# Patient Record
Sex: Female | Born: 1985 | Race: White | Hispanic: No | Marital: Married | Smoking: Never smoker
Health system: Southern US, Community
[De-identification: ages and names within clinical notes are randomized; demographics above are authoritative.]

## PROBLEM LIST (undated history)

## (undated) DIAGNOSIS — H05119 Granuloma of unspecified orbit: Secondary | ICD-10-CM

## (undated) DIAGNOSIS — O24419 Gestational diabetes mellitus in pregnancy, unspecified control: Secondary | ICD-10-CM

## (undated) DIAGNOSIS — Z8619 Personal history of other infectious and parasitic diseases: Secondary | ICD-10-CM

## (undated) DIAGNOSIS — I10 Essential (primary) hypertension: Secondary | ICD-10-CM

## (undated) DIAGNOSIS — E669 Obesity, unspecified: Secondary | ICD-10-CM

## (undated) DIAGNOSIS — W19XXXA Unspecified fall, initial encounter: Secondary | ICD-10-CM

## (undated) DIAGNOSIS — Y92009 Unspecified place in unspecified non-institutional (private) residence as the place of occurrence of the external cause: Secondary | ICD-10-CM

## (undated) DIAGNOSIS — IMO0002 Reserved for concepts with insufficient information to code with codable children: Secondary | ICD-10-CM

## (undated) DIAGNOSIS — L089 Local infection of the skin and subcutaneous tissue, unspecified: Secondary | ICD-10-CM

## (undated) HISTORY — DX: Obesity, unspecified: E66.9

## (undated) HISTORY — PX: MASTOIDECTOMY: SHX711

## (undated) HISTORY — DX: Granuloma of unspecified orbit: H05.119

## (undated) HISTORY — DX: Essential (primary) hypertension: I10

## (undated) HISTORY — PX: TONSILLECTOMY: SUR1361

## (undated) HISTORY — DX: Local infection of the skin and subcutaneous tissue, unspecified: L08.9

## (undated) HISTORY — PX: CHOLECYSTECTOMY: SHX55

## (undated) HISTORY — PX: MANDIBLE SURGERY: SHX707

## (undated) HISTORY — DX: Personal history of other infectious and parasitic diseases: Z86.19

## (undated) HISTORY — DX: Gestational diabetes mellitus in pregnancy, unspecified control: O24.419

## (undated) HISTORY — PX: BREAST ENHANCEMENT SURGERY: SHX7

---

## 2009-05-23 ENCOUNTER — Ambulatory Visit: Payer: Self-pay | Admitting: Otolaryngology

## 2011-05-06 NOTE — L&D Delivery Note (Signed)
Delivery Note At 10:29 AM a viable female was delivered via Vaginal, Spontaneous Delivery (Presentation: ;DOA).  APGAR: 8/9, ; weight pending .   Placenta status: Intact, Spontaneous.  Cord 3-VC, nuchal x 1 reduced after delivery of fetal head:  with the following complications: .  Cord pH: not indicated  Anesthesia: Epidural  Episiotomy: none Lacerations: none Suture Repair: none Est. Blood Loss (mL): 300  Mom to postpartum.  Baby to nursery-stable.  Lodema Parma A. 03/01/2012, 10:50 AM

## 2011-08-07 LAB — OB RESULTS CONSOLE HEPATITIS B SURFACE ANTIGEN: Hepatitis B Surface Ag: NEGATIVE

## 2011-08-07 LAB — OB RESULTS CONSOLE ANTIBODY SCREEN: Antibody Screen: NEGATIVE

## 2011-08-07 LAB — OB RESULTS CONSOLE GC/CHLAMYDIA
Chlamydia: NEGATIVE
Gonorrhea: NEGATIVE

## 2011-12-04 DIAGNOSIS — W19XXXA Unspecified fall, initial encounter: Secondary | ICD-10-CM

## 2011-12-04 DIAGNOSIS — Y92009 Unspecified place in unspecified non-institutional (private) residence as the place of occurrence of the external cause: Secondary | ICD-10-CM

## 2011-12-04 HISTORY — DX: Unspecified place in unspecified non-institutional (private) residence as the place of occurrence of the external cause: Y92.009

## 2011-12-04 HISTORY — DX: Unspecified fall, initial encounter: W19.XXXA

## 2012-01-18 ENCOUNTER — Inpatient Hospital Stay (HOSPITAL_COMMUNITY): Admission: AD | Admit: 2012-01-18 | Payer: Self-pay | Source: Ambulatory Visit | Admitting: Obstetrics

## 2012-02-17 ENCOUNTER — Telehealth (HOSPITAL_COMMUNITY): Payer: Self-pay | Admitting: *Deleted

## 2012-02-17 ENCOUNTER — Other Ambulatory Visit: Payer: Self-pay | Admitting: Obstetrics

## 2012-02-17 ENCOUNTER — Encounter (HOSPITAL_COMMUNITY): Payer: Self-pay | Admitting: *Deleted

## 2012-02-17 NOTE — Telephone Encounter (Signed)
Preadmission screen  

## 2012-02-28 ENCOUNTER — Encounter (HOSPITAL_COMMUNITY): Payer: Self-pay | Admitting: Pharmacist

## 2012-02-29 ENCOUNTER — Encounter (HOSPITAL_COMMUNITY): Payer: Self-pay

## 2012-02-29 ENCOUNTER — Inpatient Hospital Stay (HOSPITAL_COMMUNITY)
Admission: RE | Admit: 2012-02-29 | Discharge: 2012-03-02 | DRG: 774 | Disposition: A | Discharge status: Home / Self Care | Payer: 59 | Source: Ambulatory Visit | Attending: Obstetrics & Gynecology | Admitting: Obstetrics & Gynecology

## 2012-02-29 VITALS — BP 111/73 | HR 74 | Temp 97.7°F | Resp 18 | Ht 67.0 in | Wt 310.0 lb

## 2012-02-29 DIAGNOSIS — O10919 Unspecified pre-existing hypertension complicating pregnancy, unspecified trimester: Secondary | ICD-10-CM | POA: Diagnosis present

## 2012-02-29 DIAGNOSIS — IMO0002 Reserved for concepts with insufficient information to code with codable children: Secondary | ICD-10-CM | POA: Diagnosis present

## 2012-02-29 DIAGNOSIS — E669 Obesity, unspecified: Secondary | ICD-10-CM | POA: Diagnosis present

## 2012-02-29 DIAGNOSIS — O9903 Anemia complicating the puerperium: Secondary | ICD-10-CM | POA: Diagnosis not present

## 2012-02-29 DIAGNOSIS — O99814 Abnormal glucose complicating childbirth: Principal | ICD-10-CM | POA: Diagnosis present

## 2012-02-29 DIAGNOSIS — O24419 Gestational diabetes mellitus in pregnancy, unspecified control: Secondary | ICD-10-CM | POA: Diagnosis present

## 2012-02-29 DIAGNOSIS — O1002 Pre-existing essential hypertension complicating childbirth: Secondary | ICD-10-CM | POA: Diagnosis present

## 2012-02-29 DIAGNOSIS — D649 Anemia, unspecified: Secondary | ICD-10-CM | POA: Diagnosis not present

## 2012-02-29 HISTORY — DX: Reserved for concepts with insufficient information to code with codable children: IMO0002

## 2012-02-29 HISTORY — DX: Unspecified fall, initial encounter: W19.XXXA

## 2012-02-29 HISTORY — DX: Unspecified place in unspecified non-institutional (private) residence as the place of occurrence of the external cause: Y92.009

## 2012-02-29 LAB — GLUCOSE, CAPILLARY

## 2012-02-29 LAB — CBC
Hemoglobin: 10.5 g/dL — ABNORMAL LOW (ref 12.0–15.0)
MCH: 30.5 pg (ref 26.0–34.0)
RBC: 3.44 MIL/uL — ABNORMAL LOW (ref 3.87–5.11)

## 2012-02-29 LAB — COMPREHENSIVE METABOLIC PANEL
ALT: 11 U/L (ref 0–35)
AST: 15 U/L (ref 0–37)
Alkaline Phosphatase: 179 U/L — ABNORMAL HIGH (ref 39–117)
CO2: 20 mEq/L (ref 19–32)
Chloride: 104 mEq/L (ref 96–112)
Creatinine, Ser: 0.63 mg/dL (ref 0.50–1.10)
GFR calc non Af Amer: >90 mL/min (ref >90)
Potassium: 3.9 mEq/L (ref 3.5–5.1)
Sodium: 136 mEq/L (ref 135–145)
Total Bilirubin: 0.3 mg/dL (ref 0.3–1.2)

## 2012-02-29 MED ORDER — EPHEDRINE 5 MG/ML INJ
10.0000 mg | INTRAVENOUS | Status: DC | PRN
  Filled 2012-02-29: qty 4

## 2012-02-29 MED ORDER — ACETAMINOPHEN 325 MG PO TABS
650.0000 mg | ORAL_TABLET | ORAL | Status: DC | PRN
  Administered 2012-03-01: 650 mg via ORAL
  Filled 2012-02-29: qty 2

## 2012-02-29 MED ORDER — OXYTOCIN BOLUS FROM INFUSION
500.0000 mL | INTRAVENOUS | Status: DC
  Filled 2012-02-29 (×37): qty 500

## 2012-02-29 MED ORDER — LACTATED RINGERS IV SOLN
INTRAVENOUS | Status: DC
  Administered 2012-02-29 – 2012-03-01 (×3): via INTRAVENOUS

## 2012-02-29 MED ORDER — TERBUTALINE SULFATE 1 MG/ML IJ SOLN: 0.2500 mg | Freq: Once | INTRAMUSCULAR | Status: AC | PRN

## 2012-02-29 MED ORDER — OXYCODONE-ACETAMINOPHEN 5-325 MG PO TABS: 1.0000 | ORAL_TABLET | ORAL | Status: DC | PRN

## 2012-02-29 MED ORDER — OXYTOCIN 40 UNITS IN LACTATED RINGERS INFUSION - SIMPLE MED: 62.5000 mL/h | INTRAVENOUS | Status: DC

## 2012-02-29 MED ORDER — FENTANYL 2.5 MCG/ML BUPIVACAINE 1/10 % EPIDURAL INFUSION (WH - ANES)
14.0000 mL/h | INTRAMUSCULAR | Status: DC
  Administered 2012-03-01: 14 mL/h via EPIDURAL
  Filled 2012-02-29 (×2): qty 125

## 2012-02-29 MED ORDER — LIDOCAINE HCL (PF) 1 % IJ SOLN: 30.0000 mL | INTRAMUSCULAR | Status: DC | PRN

## 2012-02-29 MED ORDER — MISOPROSTOL 25 MCG QUARTER TABLET
25.0000 ug | ORAL_TABLET | ORAL | Status: DC | PRN
  Filled 2012-02-29: qty 0.25

## 2012-02-29 MED ORDER — EPHEDRINE 5 MG/ML INJ: 10.0000 mg | INTRAVENOUS | Status: DC | PRN

## 2012-02-29 MED ORDER — ONDANSETRON HCL 4 MG/2ML IJ SOLN
4.0000 mg | Freq: Four times a day (QID) | INTRAMUSCULAR | Status: DC | PRN
  Administered 2012-03-01: 4 mg via INTRAVENOUS
  Filled 2012-02-29: qty 2

## 2012-02-29 MED ORDER — PHENYLEPHRINE 40 MCG/ML (10ML) SYRINGE FOR IV PUSH (FOR BLOOD PRESSURE SUPPORT)
80.0000 ug | PREFILLED_SYRINGE | INTRAVENOUS | Status: DC | PRN
  Filled 2012-02-29: qty 5

## 2012-02-29 MED ORDER — OXYTOCIN 40 UNITS IN LACTATED RINGERS INFUSION - SIMPLE MED
1.0000 m[IU]/min | INTRAVENOUS | Status: DC
  Administered 2012-02-29: 2 m[IU]/min via INTRAVENOUS
  Filled 2012-02-29: qty 1000

## 2012-02-29 MED ORDER — ZOLPIDEM TARTRATE 5 MG PO TABS: 5.0000 mg | ORAL_TABLET | Freq: Every evening | ORAL | Status: DC | PRN

## 2012-02-29 MED ORDER — CITRIC ACID-SODIUM CITRATE 334-500 MG/5ML PO SOLN
30.0000 mL | ORAL | Status: DC | PRN
  Filled 2012-02-29: qty 15

## 2012-02-29 MED ORDER — PHENYLEPHRINE 40 MCG/ML (10ML) SYRINGE FOR IV PUSH (FOR BLOOD PRESSURE SUPPORT): 80.0000 ug | PREFILLED_SYRINGE | INTRAVENOUS | Status: DC | PRN

## 2012-02-29 MED ORDER — DIPHENHYDRAMINE HCL 50 MG/ML IJ SOLN: 12.5000 mg | INTRAMUSCULAR | Status: DC | PRN

## 2012-02-29 MED ORDER — LACTATED RINGERS IV SOLN
500.0000 mL | Freq: Once | INTRAVENOUS | Status: AC
  Administered 2012-03-01: 500 mL via INTRAVENOUS

## 2012-02-29 MED ORDER — LACTATED RINGERS IV SOLN: 500.0000 mL | INTRAVENOUS | Status: DC | PRN

## 2012-02-29 MED ORDER — IBUPROFEN 600 MG PO TABS: 600.0000 mg | ORAL_TABLET | Freq: Four times a day (QID) | ORAL | Status: DC | PRN

## 2012-02-29 NOTE — H&P (Signed)
Samika Vetsch is a 26 y.o. G2P1 at [redacted]w[redacted]d presenting for IOL due to chronic htn and GDM. Pt notes rare contractions. Good fetal movement, No vaginal bleeding, not leaking fluid. No HA, no vision change, no RUQ pain  [facility] since 9 wks PN Issues: - chronic htn, bp remained stable throughout 3rd trimester, 130s/80s. Nl PIH labs. - GDM. Passed 3 hr GTT but given LGA, large AC, BS monitored in 2nd/3rd trimester. FBS >95s and pt started on glyburide. A1C stayed in 4's and BS controlled w/ diet/ glyburide - obesity - s/p Tdap   Prenatal Transfer Tool  Maternal Diabetes: Yes:  Diabetes Type:  Insulin/Medication controlled Genetic Screening: Normal Maternal Ultrasounds/Referrals: Normal Fetal Ultrasounds or other Referrals:  None Maternal Substance Abuse:  No Significant Maternal Medications:  Meds include: Other: glyburide Significant Maternal Lab Results: None     OB History    Grav Para Term Preterm Abortions TAB SAB Ect Mult Living   2 1        1     G1_ SVD at 7'14, PPH, PIH  Past Medical History  Diagnosis Date  . Obese   . H/O varicella   . Orbital pseudotumor     stable annual eye exams  . Unspecified local infection of skin and subcutaneous tissue   . Gestational diabetes     glyburide  . Hypertension     gestational   Past Surgical History  Procedure Date  . Tonsillectomy   . Mastoidectomy     Left   Family History: family history includes COPD in her maternal grandmother; Cancer in her father and maternal grandfather; Diabetes in her maternal grandfather; Migraines in her father; and Thrombocytopenia in her mother. Social History:  reports that she has never smoked. She has never used smokeless tobacco. She reports that she does not drink alcohol or use illicit drugs. Review of Systems - Negative except pregnancy  All: prednisone, tetracycline Meds: PNV, glyburide    Blood pressure 154/82, pulse 72, temperature 98.4 F (36.9 C), temperature  source Oral, resp. rate 20, height 5\' 7"  (1.702 m), weight 140.615 kg (310 lb).  Physical Exam:  Gen: well appearing, no distress CV: RRR Pulm: CTAB Back: no CVAT Abd: gravid, NT, no RUQ pain LE: trace edema, equal bilaterally, non-tender Toco: occasional FH: baseline 135s, accelerations present, no deceleratons, 10 beat variability Cvx: 2/ 20%/ vtx/ mid/ med/ -3 EFW: 8#  Prenatal labs: ABO, Rh: A/Positive/-- (04/04 0000) Antibody: Negative (04/04 0000) Rubella:  immune RPR: Nonreactive (04/04 0000)  HBsAg: Negative (04/04 0000)  HIV: Non-reactive (04/04 0000)  GBS: Negative (09/27 0000)  1 hr Glucola nl 3 hr GTT  Genetic screening nl quad Anatomy US normal   Assessment/Plan: 26 y.o. G2P1 at [redacted]w[redacted]d IOL at term due to chronic htn and GDM - chronic htn. Check PIH labs on admit, watch bp closely - GDM. diebetic clears check BS q 2 hrs when in active labor - GBS neg - h/o PPH. Watch closely  - IOL, given rapid progress in prior preg, 2 cm, will plan pitocin now.  Dean Wonder A. 02/29/2012, 8:24 PM

## 2012-03-01 ENCOUNTER — Encounter (HOSPITAL_COMMUNITY): Payer: Self-pay

## 2012-03-01 ENCOUNTER — Inpatient Hospital Stay (HOSPITAL_COMMUNITY): Payer: 59 | Admitting: Anesthesiology

## 2012-03-01 ENCOUNTER — Encounter (HOSPITAL_COMMUNITY): Payer: Self-pay | Admitting: Anesthesiology

## 2012-03-01 DIAGNOSIS — O24419 Gestational diabetes mellitus in pregnancy, unspecified control: Secondary | ICD-10-CM | POA: Diagnosis present

## 2012-03-01 DIAGNOSIS — O10919 Unspecified pre-existing hypertension complicating pregnancy, unspecified trimester: Secondary | ICD-10-CM | POA: Diagnosis present

## 2012-03-01 LAB — RPR: RPR Ser Ql: NONREACTIVE

## 2012-03-01 LAB — GLUCOSE, CAPILLARY: Glucose-Capillary: 96 mg/dL (ref 70–99)

## 2012-03-01 MED ORDER — BISACODYL 10 MG RE SUPP: 10.0000 mg | Freq: Every day | RECTAL | Status: DC | PRN

## 2012-03-01 MED ORDER — IBUPROFEN 600 MG PO TABS
600.0000 mg | ORAL_TABLET | Freq: Four times a day (QID) | ORAL | Status: DC
  Administered 2012-03-01 – 2012-03-02 (×5): 600 mg via ORAL
  Filled 2012-03-01 (×5): qty 1

## 2012-03-01 MED ORDER — DIBUCAINE 1 % RE OINT: 1.0000 "application " | TOPICAL_OINTMENT | RECTAL | Status: DC | PRN

## 2012-03-01 MED ORDER — LIDOCAINE HCL (PF) 1 % IJ SOLN
INTRAMUSCULAR | Status: DC | PRN
  Administered 2012-03-01: 8 mL

## 2012-03-01 MED ORDER — MISOPROSTOL 200 MCG PO TABS
ORAL_TABLET | ORAL | Status: AC
  Filled 2012-03-01: qty 4

## 2012-03-01 MED ORDER — LANOLIN HYDROUS EX OINT: TOPICAL_OINTMENT | CUTANEOUS | Status: DC | PRN

## 2012-03-01 MED ORDER — BENZOCAINE-MENTHOL 20-0.5 % EX AERO
1.0000 "application " | INHALATION_SPRAY | CUTANEOUS | Status: DC | PRN
  Administered 2012-03-01: 1 via TOPICAL
  Filled 2012-03-01: qty 56

## 2012-03-01 MED ORDER — WITCH HAZEL-GLYCERIN EX PADS: 1.0000 "application " | MEDICATED_PAD | CUTANEOUS | Status: DC | PRN

## 2012-03-01 MED ORDER — PRENATAL MULTIVITAMIN CH
1.0000 | ORAL_TABLET | Freq: Every day | ORAL | Status: DC
  Administered 2012-03-01 – 2012-03-02 (×2): 1 via ORAL
  Filled 2012-03-01 (×2): qty 1

## 2012-03-01 MED ORDER — SIMETHICONE 80 MG PO CHEW: 80.0000 mg | CHEWABLE_TABLET | ORAL | Status: DC | PRN

## 2012-03-01 MED ORDER — FENTANYL 2.5 MCG/ML BUPIVACAINE 1/10 % EPIDURAL INFUSION (WH - ANES)
INTRAMUSCULAR | Status: DC | PRN
  Administered 2012-03-01: 16 mL/h via EPIDURAL

## 2012-03-01 MED ORDER — ONDANSETRON HCL 4 MG PO TABS: 4.0000 mg | ORAL_TABLET | ORAL | Status: DC | PRN

## 2012-03-01 MED ORDER — TETANUS-DIPHTH-ACELL PERTUSSIS 5-2.5-18.5 LF-MCG/0.5 IM SUSP: 0.5000 mL | Freq: Once | INTRAMUSCULAR | Status: DC

## 2012-03-01 MED ORDER — ZOLPIDEM TARTRATE 5 MG PO TABS: 5.0000 mg | ORAL_TABLET | Freq: Every evening | ORAL | Status: DC | PRN

## 2012-03-01 MED ORDER — SENNOSIDES-DOCUSATE SODIUM 8.6-50 MG PO TABS
2.0000 | ORAL_TABLET | Freq: Every day | ORAL | Status: DC
  Administered 2012-03-01: 2 via ORAL

## 2012-03-01 MED ORDER — ONDANSETRON HCL 4 MG/2ML IJ SOLN: 4.0000 mg | INTRAMUSCULAR | Status: DC | PRN

## 2012-03-01 MED ORDER — FLEET ENEMA 7-19 GM/118ML RE ENEM: 1.0000 | ENEMA | Freq: Every day | RECTAL | Status: DC | PRN

## 2012-03-01 MED ORDER — OXYCODONE-ACETAMINOPHEN 5-325 MG PO TABS
1.0000 | ORAL_TABLET | ORAL | Status: DC | PRN
  Administered 2012-03-01 – 2012-03-02 (×4): 1 via ORAL
  Filled 2012-03-01 (×4): qty 1

## 2012-03-01 MED ORDER — DIPHENHYDRAMINE HCL 25 MG PO CAPS: 25.0000 mg | ORAL_CAPSULE | Freq: Four times a day (QID) | ORAL | Status: DC | PRN

## 2012-03-01 NOTE — Progress Notes (Signed)
Comfortable w/ epidural. Notes nausea w/ emesis and shaking  O:  Filed Vitals:   03/01/12 0731 03/01/12 0801 03/01/12 0831 03/01/12 0903  BP: 162/87 167/76 174/94 156/71  Pulse: 68 72 102 81  Temp:      TempSrc:      Resp:      Height:      Weight:      SpO2:       Abd: gravid, NT, EFW 8.5# #cvx: rim from 6 to 10'click, vtx 0 Toco: q 2 mn, pit at 5 munit/min FH: baseline change from 130's to 150's, late decels w/ most contractions, rapid recovery <14min, 5 beat variability  A/P: IOL due to chronic htn and GDM - BS stable, watch PP - h/o PPH - FWB. Baseline change, decels concerning, expect delivery soon, cont close management, O2 - bp noted. Aware of risks of abruption  Vanessa Pratt A. 03/01/2012 9:16 AM

## 2012-03-01 NOTE — Progress Notes (Signed)
S: pt comfortable w/ epidural. No LOF.   OCeasar Mons Vitals:   03/01/12 0116 03/01/12 0131 03/01/12 0201 03/01/12 0231  BP: 141/84 145/82 153/74 144/79  Pulse: 61 70 67 91  Temp:      TempSrc:      Resp: 18 18 18 18   Height:      Weight:      SpO2:       Toco: q 2-3 min, pit at 5 munits/ min FH: 130's, + accels, no decels, 10 beat variability Cvx: ext os 4 cm, int os 2 cm, vtx -3/ ant/ medium consistency  AROM- clear  A/P: G2P1 at 39'3 w/ GDM and chronic htn for IOL - IOL. AROM now, IUPC placed. Progress slower than expected. Cont to titrate pitocin - GDM. Will watch BS when in active labor - chronic htn. Cont to watch. - h/o PPH.   Christa Fasig A. 03/01/2012 3:32 AM

## 2012-03-01 NOTE — Anesthesia Procedure Notes (Signed)
Epidural Patient location during procedure: OB Start time: 03/01/2012 12:12 AM End time: 03/01/2012 12:34 AM  Staffing Anesthesiologist: Lewie Loron R Performed by: anesthesiologist   Preanesthetic Checklist Completed: patient identified, site marked, surgical consent, pre-op evaluation, timeout performed, IV checked, risks and benefits discussed and monitors and equipment checked  Epidural Patient position: sitting Prep: DuraPrep Patient monitoring: heart rate, continuous pulse ox and blood pressure Approach: midline Injection technique: LOR air  Needle:  Needle type: Tuohy  Needle gauge: 17 G Needle length: 9 cm Needle insertion depth: 9 cm Catheter type: closed end flexible Catheter size: 19 Gauge Catheter at skin depth: 15 cm Test dose: negative  Assessment Sensory level: T8

## 2012-03-01 NOTE — Anesthesia Preprocedure Evaluation (Signed)
Anesthesia Evaluation  Patient identified by MRN, date of birth, ID band Patient awake    Reviewed: Allergy & Precautions, H&P , NPO status , Patient's Chart, lab work & pertinent test results, reviewed documented beta blocker date and time   Airway Mallampati: II TM Distance: >3 FB Neck ROM: full    Dental No notable dental hx.    Pulmonary neg pulmonary ROS,  breath sounds clear to auscultation  Pulmonary exam normal       Cardiovascular hypertension, Rhythm:regular Rate:Normal     Neuro/Psych negative neurological ROS  negative psych ROS   GI/Hepatic negative GI ROS, Neg liver ROS,   Endo/Other  Gestational, Oral Hypoglycemic Agents  Renal/GU negative Renal ROS  negative genitourinary   Musculoskeletal   Abdominal Normal abdominal exam  (+)   Peds  Hematology negative hematology ROS (+)   Anesthesia Other Findings   Reproductive/Obstetrics (+) Pregnancy                           Anesthesia Physical Anesthesia Plan  ASA: II  Anesthesia Plan: Epidural   Post-op Pain Management:    Induction:   Airway Management Planned:   Additional Equipment:   Intra-op Plan:   Post-operative Plan:   Informed Consent: I have reviewed the patients History and Physical, chart, labs and discussed the procedure including the risks, benefits and alternatives for the proposed anesthesia with the patient or authorized representative who has indicated his/her understanding and acceptance.     Plan Discussed with: Anesthesiologist  Anesthesia Plan Comments:         Anesthesia Quick Evaluation

## 2012-03-02 ENCOUNTER — Encounter (HOSPITAL_COMMUNITY): Payer: Self-pay

## 2012-03-02 DIAGNOSIS — IMO0002 Reserved for concepts with insufficient information to code with codable children: Secondary | ICD-10-CM

## 2012-03-02 HISTORY — DX: Reserved for concepts with insufficient information to code with codable children: IMO0002

## 2012-03-02 LAB — TYPE AND SCREEN
Antibody Screen: NEGATIVE
Unit division: 0
Unit division: 0

## 2012-03-02 LAB — CBC
HCT: 27.9 % — ABNORMAL LOW (ref 36.0–46.0)
Hemoglobin: 9.5 g/dL — ABNORMAL LOW (ref 12.0–15.0)
RBC: 3.17 MIL/uL — ABNORMAL LOW (ref 3.87–5.11)
RDW: 13 % (ref 11.5–15.5)
WBC: 12.5 10*3/uL — ABNORMAL HIGH (ref 4.0–10.5)

## 2012-03-02 LAB — URIC ACID: Uric Acid, Serum: 5.3 mg/dL (ref 2.4–7.0)

## 2012-03-02 LAB — COMPREHENSIVE METABOLIC PANEL
Albumin: 1.9 g/dL — ABNORMAL LOW (ref 3.5–5.2)
Alkaline Phosphatase: 136 U/L — ABNORMAL HIGH (ref 39–117)
BUN: 9 mg/dL (ref 6–23)
CO2: 23 mEq/L (ref 19–32)
Chloride: 104 mEq/L (ref 96–112)
GFR calc non Af Amer: >90 mL/min (ref >90)
Glucose, Bld: 91 mg/dL (ref 70–99)
Potassium: 3.8 mEq/L (ref 3.5–5.1)
Total Bilirubin: 0.2 mg/dL — ABNORMAL LOW (ref 0.3–1.2)

## 2012-03-02 MED ORDER — IBUPROFEN 600 MG PO TABS: 600.0000 mg | ORAL_TABLET | Freq: Four times a day (QID) | ORAL | Status: DC

## 2012-03-02 MED ORDER — POLYSACCHARIDE IRON COMPLEX 150 MG PO CAPS: 150.0000 mg | ORAL_CAPSULE | Freq: Every day | ORAL | Status: DC

## 2012-03-02 NOTE — Discharge Summary (Signed)
Obstetric Discharge Summary Reason for Admission: induction of labor and GDM A2, CHTN, obesity Prenatal Procedures: NST and ultrasound Intrapartum Procedures: spontaneous vaginal delivery and epidural Postpartum Procedures: none Complications-Operative and Postpartum: none Hemoglobin  Date Value Range Status  03/02/2012 9.5* 12.0 - 15.0 g/dL Final     HCT  Date Value Range Status  03/02/2012 27.9* 36.0 - 46.0 % Final    Physical Exam:  General: alert, cooperative and no distress Lochia: appropriate Uterine Fundus: firm Incision: healing well DVT Evaluation: Negative Homan's sign. No significant calf/ankle edema.  Discharge Diagnoses: Term Pregnancy-delivered Maternal anemia asymptomatic Discharge Information: Date: 03/02/2012 Activity: pelvic rest Diet: carb mod diet Medications: PNV, Ibuprofen and Iron Condition: stable Instructions: refer to practice specific booklet Discharge to: home Follow-up Information    Follow up with [facility] A., MD. Schedule an appointment as soon as possible for a visit in 6 weeks.   Contact information:   [address] [place] [address] [phone]          Newborn Data: Live born female  Birth Weight: 8 lb 9 oz (3885 g) APGAR: 8, 9  Home with mother.  PAUL,DANIELA 03/02/2012, 9:22 AM

## 2012-03-02 NOTE — Progress Notes (Signed)
PPD 1 SVD  S:  Reports feeling well, desires discharge today.             Tolerating po/ No nausea or vomiting             Bleeding is decreasing.             Pain controlled with Motrin and occasional Percocet.             Up ad lib / ambulatory / voiding well.   Newborn  Information for the patient's newborn:  Denasia, Venn [[id number]]  female  breast feeding  / Circumcision pending today.   O:  A & O x 3 NAD             VS:  Filed Vitals:   03/01/12 1830 03/01/12 2200 03/02/12 0209 03/02/12 0635  BP: 127/75 136/76 130/78 111/73  Pulse: 66 78 78 74  Temp: 98.2 F (36.8 C) 97.6 F (36.4 C) 97.8 F (36.6 C) 97.7 F (36.5 C)  TempSrc: Oral Oral Oral   Resp: 18 20 18 18   Height:      Weight:      SpO2:  96%      LABS:  Basename 03/02/12 0530 02/29/12 2000  WBC 12.5* 9.6  HGB 9.5* 10.5*  HCT 27.9* 30.1*  PLT 162 185   CBG (last 3)   Basename 03/01/12 0938 03/01/12 0740 03/01/12 0538  GLUCAP 80 93 96      Blood type: --/--/A POS, A POS (10/27 2000)  Rubella: Immune (04/04 0000)   I&O: I/O last 3 completed shifts: In: -  Out: 700 [Urine:400; Blood:300]        Abdomen: soft, non-tender, non-distended, obese.             Fundus: firm, non-tender, U -1  Perineum: intact  Lochia: small  Extremities: trace pedal edema, no calf pain or tenderness, neg Homans    A/P: PPD # 1 26 y.o., [id number]    Active Problems:  NSVD (normal spontaneous vaginal delivery - 10/28)  Chronic hypertension complicating or reason for care during pregnancy  Gestational diabetes mellitus, class A2  Postpartum care following vaginal delivery  Maternal anemia complicating pregnancy, childbirth, or the puerperium TDaP and flu vaccine up to date. BP stable Anemia asymptomatic, plan oral iron supplement 4-6 wks PP GDM A2, continue carb mod diet, f/u DS at 6 wks PP   Doing well - stable status  Routine post partum orders  DC home today if infant DC by peds service  Jaylen Knope,  CNM 03/02/2012, 8:58 AM

## 2012-03-02 NOTE — Anesthesia Postprocedure Evaluation (Signed)
  Anesthesia Post-op Note  Patient: Vanessa Pratt  Procedure(s) Performed: * No procedures listed *  Patient Location: Mother/Baby  Anesthesia Type:Epidural  Level of Consciousness: awake, alert  and oriented  Airway and Oxygen Therapy: Patient Spontanous Breathing  Post-op Pain: none  Post-op Assessment: Post-op Vital signs reviewed and Patient's Cardiovascular Status Stable  Post-op Vital Signs: Reviewed and stable  Complications: No apparent anesthesia complications

## 2012-03-05 ENCOUNTER — Inpatient Hospital Stay (HOSPITAL_COMMUNITY): Admission: AD | Admit: 2012-03-05 | Payer: Self-pay | Source: Ambulatory Visit | Admitting: Obstetrics

## 2014-03-06 ENCOUNTER — Encounter (HOSPITAL_COMMUNITY): Payer: Self-pay

## 2015-02-28 ENCOUNTER — Telehealth: Payer: Self-pay

## 2015-02-28 NOTE — Telephone Encounter (Signed)
Patient is requesting that she have her [facility] eHealthScreenings form signed by a physician.  Patient states that she faxed to our office on 02/21/15 and is just being told today that she needs an appointment. Patient's husband faxed the same form to our office and it was signed by Dr. Dossie Arbourrissman without an office visit and patient states that she is requesting the same. Will investigate and call patient back at [phone].

## 2015-02-28 NOTE — Telephone Encounter (Signed)
Called to advise patient that eHealthScreening form will not be signed by Dr. Sherie DonLada unless patient has an appointment.  Patient did not keep her appointment on 09/11/14 to discuss weight loss options.

## 2015-10-09 DIAGNOSIS — M7050 Other bursitis of knee, unspecified knee: Secondary | ICD-10-CM | POA: Insufficient documentation

## 2015-11-20 DIAGNOSIS — G44229 Chronic tension-type headache, not intractable: Secondary | ICD-10-CM | POA: Insufficient documentation

## 2015-11-20 DIAGNOSIS — G932 Benign intracranial hypertension: Secondary | ICD-10-CM | POA: Insufficient documentation

## 2016-02-04 DIAGNOSIS — M2619 Other specified anomalies of jaw-cranial base relationship: Secondary | ICD-10-CM | POA: Insufficient documentation

## 2018-05-05 HISTORY — PX: GASTRIC BYPASS: SHX52

## 2018-07-30 DIAGNOSIS — K219 Gastro-esophageal reflux disease without esophagitis: Secondary | ICD-10-CM | POA: Insufficient documentation

## 2019-03-07 DIAGNOSIS — Z9884 Bariatric surgery status: Secondary | ICD-10-CM | POA: Insufficient documentation

## 2019-12-26 ENCOUNTER — Ambulatory Visit: Admit: 2019-12-26 | Discharge status: Against Medical Advice | Payer: Commercial Managed Care - PPO

## 2019-12-26 ENCOUNTER — Ambulatory Visit: Payer: Commercial Managed Care - PPO | Attending: Family Medicine

## 2019-12-26 ENCOUNTER — Other Ambulatory Visit: Payer: Self-pay

## 2019-12-26 DIAGNOSIS — R109 Unspecified abdominal pain: Secondary | ICD-10-CM | POA: Diagnosis present

## 2019-12-26 DIAGNOSIS — R1011 Right upper quadrant pain: Secondary | ICD-10-CM | POA: Diagnosis present

## 2019-12-28 ENCOUNTER — Ambulatory Visit: Payer: Self-pay | Admitting: Surgery

## 2019-12-28 NOTE — H&P (Signed)
Subjective:   CC: Biliary colic [K80.50]  HPI:  Vanessa Pratt is a 34 y.o. female who was referred by James F Hedrick, MD for evaluation of above CC. Symptoms were first noted several days ago. Pain is sharp, confined to the right upper quadrant, with radiating to back.  Associated with n/v, exacerbated by nothing specific.  Lasted for few hours, then resolved.  No issues since, but workup afterwards noted below.     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  Past Surgical History:  has a past surgical history that includes Mastoidectomy (1995); Tonsillectomy; reconstruction midface lefort w/o bone graft (Bilateral, 07/24/2016); extraction teeth (Bilateral, 07/24/2016); reconstruction midface lefort w/o bone graft (Bilateral, 03/02/2017); Skin biopsy; egd (N/A, 10/29/2018); egd (N/A, 02/22/2019); laparoscopic repair paraesophageal hiatal hernia (N/A, 02/22/2019); and Hernia repair (02/22/2019).  Family History: family history includes Alcohol abuse in her father and mother; Cirrhosis in her mother; Diabetes type II in her maternal grandfather; Glaucoma in her maternal grandmother; Hepatitis C in her mother; High blood pressure (Hypertension) in her maternal grandmother; Liver cancer in her maternal grandfather; Pancreatic cancer in her father.  Social History:  reports that she has never smoked. She has never used smokeless tobacco. She reports previous alcohol use. She reports that she does not use drugs.  Current Medications: has a current medication list which includes the following prescription(s): acetaminophen, cetirizine, diphenhydramine, fluticasone propionate, polyethylene glycol, and gabapentin.  Allergies:  Allergies as of  12/28/2019 - Reviewed 12/28/2019  Allergen Reaction Noted  . Demeclocycline Other (See Comments) 02/17/2012  . Doxycycline monohydrate Other (See Comments) 02/02/2015  . Prednisone Unknown, Other (See Comments), and Hives 02/29/2012  . Tetracycline Unknown and Hives 02/17/2012    ROS:  A 15 point review of systems was performed and pertinent positives and negatives noted in HPI    Objective:     BP 115/70   Pulse 56   Ht 170.2 cm (5' 7")   Wt 88.9 kg (196 lb)   BMI 30.70 kg/m    Constitutional :  alert, appears stated age, cooperative and no distress  Lymphatics/Throat:  no asymmetry, masses, or scars  Respiratory:  clear to auscultation bilaterally  Cardiovascular:  regular rate and rhythm  Gastrointestinal: soft, non-tender; bowel sounds normal; no masses,  no organomegaly.    Musculoskeletal: Steady gait and movement  Skin: Cool and moist  Psychiatric: Normal affect, non-agitated, not confused       LABS:  - Lab Results  Component Value Date   WBC 7.9 12/26/2019   HGB 11.9 (L) 12/26/2019   HCT 35.2 12/26/2019   PLT 181 12/26/2019   - Lab Results  Component Value Date   NA 140 12/26/2019   K 4.8 12/26/2019   CL 106 12/26/2019   CO2 32.5 (H) 12/26/2019   BUN 13 12/26/2019   CREATININE 0.8 12/26/2019   CALCIUM 9.4 12/26/2019   ALB 4.3 12/26/2019   TBILI 1.1 12/26/2019   ALKPHOS 106 (H) 12/26/2019   AST 537 (H) 12/26/2019   ALT 229 (H) 12/26/2019   GLUCOSE 101 12/26/2019   GFR 82 12/26/2019     RADS: CLINICAL DATA: Upper abdominal pain with nausea and vomiting   EXAM:  ULTRASOUND ABDOMEN LIMITED RIGHT UPPER QUADRANT   COMPARISON: None.   FINDINGS:  Gallbladder:   Within the gallbladder,   there are echogenic foci which move and  shadow consistentwith cholelithiasis. Largest gallstone measures  1.2 cm in length. Slight sludge is also noted in the gallbladder.  There is no gallbladder wall thickening or pericholecystic fluid. No  sonographic  Murphy sign noted by sonographer.   Common bile duct:   Diameter: 4 mm. No intrahepatic or extrahepatic biliary duct  dilatation.   Liver:   No focal lesion identified. Within normal limits in parenchymal  echogenicity. Portal vein is patent on color Doppler imaging with  normal direction of blood flow towards the liver.   Other: None.   IMPRESSION:  Cholelithiasis with mild intermingled sludge in the gallbladder. No  gallbladder wall thickening or pericholecystic fluid. Study  otherwise unremarkable.    Electronically Signed  By: Bretta Bang III M.D.  On: 12/26/2019 16:07  Assessment:      Biliary colic [K80.50]  Hx of gastric bypass  Plan:     1. Biliary colic [K80.50] Discussed the risk of surgery including post-op infxn, seroma, biloma, chronic pain, poor-delayed wound healing, retained gallstone, conversion to open procedure, post-op SBO or ileus, and need for additional procedures to address said risks.  The risks of general anesthetic including MI, CVA, sudden death or even reaction to anesthetic medications also discussed. Alternatives include continued observation.  Benefits include possible symptom relief, prevention of complications including acute cholecystitis, pancreatitis.  Typical post operative recovery of 3-5 days rest, continued pain in area and incision sites, possible loose stools up to 4-6 weeks, also discussed.  ED return precautions given for sudden increase in RUQ pain, with possible accompanying fever, nausea, and/or vomiting.  The patient understands the risks, any and all questions were answered to the patient's satisfaction.  2. Patient has elected to proceed with surgical treatment. Procedure will be scheduled.  Written consent was obtained..robotic assisted laparoscopic.  Discussed increased risk from previous surgeries

## 2019-12-28 NOTE — H&P (View-Only) (Signed)
Subjective:   CC: Biliary colic [K80.50]  HPI:  Vanessa Pratt is a 34 y.o. female who was referred by Sandie Ano, MD for evaluation of above CC. Symptoms were first noted several days ago. Pain is sharp, confined to the right upper quadrant, with radiating to back.  Associated with n/v, exacerbated by nothing specific.  Lasted for few hours, then resolved.  No issues since, but workup afterwards noted below.     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  Past Surgical History:  has a past surgical history that includes Mastoidectomy (1995); Tonsillectomy; reconstruction midface lefort w/o bone graft (Bilateral, 07/24/2016); extraction teeth (Bilateral, 07/24/2016); reconstruction midface lefort w/o bone graft (Bilateral, 03/02/2017); Skin biopsy; egd (N/A, 10/29/2018); egd (N/A, 02/22/2019); laparoscopic repair paraesophageal hiatal hernia (N/A, 02/22/2019); and Hernia repair (02/22/2019).  Family History: family history includes Alcohol abuse in her father and mother; Cirrhosis in her mother; Diabetes type II in her maternal grandfather; Glaucoma in her maternal grandmother; Hepatitis C in her mother; High blood pressure (Hypertension) in her maternal grandmother; Liver cancer in her maternal grandfather; Pancreatic cancer in her father.  Social History:  reports that she has never smoked. She has never used smokeless tobacco. She reports previous alcohol use. She reports that she does not use drugs.  Current Medications: has a current medication list which includes the following prescription(s): acetaminophen, cetirizine, diphenhydramine, fluticasone propionate, polyethylene glycol, and gabapentin.  Allergies:  Allergies as of  12/28/2019 - Reviewed 12/28/2019  Allergen Reaction Noted  . Demeclocycline Other (See Comments) 02/17/2012  . Doxycycline monohydrate Other (See Comments) 02/02/2015  . Prednisone Unknown, Other (See Comments), and Hives 02/29/2012  . Tetracycline Unknown and Hives 02/17/2012    ROS:  A 15 point review of systems was performed and pertinent positives and negatives noted in HPI    Objective:     BP 115/70   Pulse 56   Ht 170.2 cm (5\' 7" )   Wt 88.9 kg (196 lb)   BMI 30.70 kg/m    Constitutional :  alert, appears stated age, cooperative and no distress  Lymphatics/Throat:  no asymmetry, masses, or scars  Respiratory:  clear to auscultation bilaterally  Cardiovascular:  regular rate and rhythm  Gastrointestinal: soft, non-tender; bowel sounds normal; no masses,  no organomegaly.    Musculoskeletal: Steady gait and movement  Skin: Cool and moist  Psychiatric: Normal affect, non-agitated, not confused       LABS:  - Lab Results  Component Value Date   WBC 7.9 12/26/2019   HGB 11.9 (L) 12/26/2019   HCT 35.2 12/26/2019   PLT 181 12/26/2019   - Lab Results  Component Value Date   NA 140 12/26/2019   K 4.8 12/26/2019   CL 106 12/26/2019   CO2 32.5 (H) 12/26/2019   BUN 13 12/26/2019   CREATININE 0.8 12/26/2019   CALCIUM 9.4 12/26/2019   ALB 4.3 12/26/2019   TBILI 1.1 12/26/2019   ALKPHOS 106 (H) 12/26/2019   AST 537 (H) 12/26/2019   ALT 229 (H) 12/26/2019   GLUCOSE 101 12/26/2019   GFR 82 12/26/2019     RADS: CLINICAL DATA: Upper abdominal pain with nausea and vomiting   EXAM:  ULTRASOUND ABDOMEN LIMITED RIGHT UPPER QUADRANT   COMPARISON: None.   FINDINGS:  Gallbladder:   Within the gallbladder,  there are echogenic foci which move and  shadow consistentwith cholelithiasis. Largest gallstone measures  1.2 cm in length. Slight sludge is also noted in the gallbladder.  There is no gallbladder wall thickening or pericholecystic fluid. No  sonographic  Murphy sign noted by sonographer.   Common bile duct:   Diameter: 4 mm. No intrahepatic or extrahepatic biliary duct  dilatation.   Liver:   No focal lesion identified. Within normal limits in parenchymal  echogenicity. Portal vein is patent on color Doppler imaging with  normal direction of blood flow towards the liver.   Other: None.   IMPRESSION:  Cholelithiasis with mild intermingled sludge in the gallbladder. No  gallbladder wall thickening or pericholecystic fluid. Study  otherwise unremarkable.    Electronically Signed  By: Bretta Bang III M.D.  On: 12/26/2019 16:07  Assessment:      Biliary colic [K80.50]  Hx of gastric bypass  Plan:     1. Biliary colic [K80.50] Discussed the risk of surgery including post-op infxn, seroma, biloma, chronic pain, poor-delayed wound healing, retained gallstone, conversion to open procedure, post-op SBO or ileus, and need for additional procedures to address said risks.  The risks of general anesthetic including MI, CVA, sudden death or even reaction to anesthetic medications also discussed. Alternatives include continued observation.  Benefits include possible symptom relief, prevention of complications including acute cholecystitis, pancreatitis.  Typical post operative recovery of 3-5 days rest, continued pain in area and incision sites, possible loose stools up to 4-6 weeks, also discussed.  ED return precautions given for sudden increase in RUQ pain, with possible accompanying fever, nausea, and/or vomiting.  The patient understands the risks, any and all questions were answered to the patient's satisfaction.  2. Patient has elected to proceed with surgical treatment. Procedure will be scheduled.  Written consent was obtained..robotic assisted laparoscopic.  Discussed increased risk from previous surgeries

## 2019-12-29 ENCOUNTER — Other Ambulatory Visit: Payer: Self-pay

## 2019-12-29 ENCOUNTER — Other Ambulatory Visit
Admission: RE | Admit: 2019-12-29 | Discharge: 2019-12-29 | Disposition: A | Discharge status: Home / Self Care | Payer: Commercial Managed Care - PPO | Source: Ambulatory Visit | Attending: Surgery | Admitting: Surgery

## 2019-12-29 ENCOUNTER — Ambulatory Visit: Payer: Self-pay | Admitting: Surgery

## 2019-12-29 DIAGNOSIS — Z01812 Encounter for preprocedural laboratory examination: Secondary | ICD-10-CM | POA: Insufficient documentation

## 2019-12-29 DIAGNOSIS — Z20822 Contact with and (suspected) exposure to covid-19: Secondary | ICD-10-CM | POA: Diagnosis not present

## 2019-12-29 LAB — SARS CORONAVIRUS 2 (TAT 6-24 HRS): SARS Coronavirus 2: NEGATIVE

## 2019-12-30 ENCOUNTER — Other Ambulatory Visit: Payer: Self-pay

## 2019-12-30 ENCOUNTER — Encounter
Admission: RE | Admit: 2019-12-30 | Discharge: 2019-12-30 | Disposition: A | Discharge status: Home / Self Care | Payer: Commercial Managed Care - PPO | Source: Ambulatory Visit | Attending: Surgery | Admitting: Surgery

## 2019-12-30 NOTE — Patient Instructions (Signed)
Your procedure is scheduled on: Monday 01/02/20.  Report to DAY SURGERY DEPARTMENT LOCATED ON 2ND FLOOR MEDICAL MALL ENTRANCE. To find out your arrival time please call [phone] between 1PM - 3PM on  Friday 12/30/19.   Remember: Instructions that are not followed completely may result in serious medical risk, up to and including death, or upon the discretion of your surgeon and anesthesiologist your surgery may need to be rescheduled.     __X__ 1. Do not eat food after midnight the night before your procedure.                 No gum chewing or hard candies. You may drink clear liquids up to 2 hours                 before you are scheduled to arrive for your surgery- DO NOT drink clear                 liquids within 2 hours of the start of your surgery.                 Clear Liquids include:  water, apple juice without pulp, clear carbohydrate                 drink such as Clearfast or Gatorade, Black Coffee or Tea (Do not add                 milk or creamer to coffee or tea).  __X__2.  On the morning of surgery brush your teeth with toothpaste and water, you may rinse your mouth with mouthwash if you wish.  Do not swallow any toothpaste or mouthwash.    __X__ 3.  No Alcohol for 24 hours before or after surgery.  __X__ 4.  Do Not Smoke or use e-cigarettes For 24 Hours Prior to Your Surgery.                 Do not use any chewable tobacco products for at least 6 hours prior to                 surgery.  __X__5.  Notify your doctor if there is any change in your medical condition      (cold, fever, infections).      Do NOT wear jewelry, make-up, hairpins, clips or nail polish. Do NOT wear lotions, powders, or perfumes.  Do NOT shave 48 hours prior to surgery. Men may shave face and neck. Do NOT bring valuables to the hospital.     [facility] is not responsible for any belongings or valuables.   Contacts, dentures/partials or body piercings may not be worn into surgery. Bring a  case for your contacts, glasses or hearing aids, a denture cup will be supplied. Leave your suitcase in the car. After surgery it may be brought to your room.   For patients admitted to the hospital, discharge time is determined by your treatment team.    Patients discharged the day of surgery will not be allowed to drive home.     __X__ Take these medicines the morning of surgery with A SIP OF WATER:     1. cetirizine (ZYRTEC) or diphenhydrAMINE (BENADRYL) if needed  2. fluticasone (FLONASE) if needed      __X__ Shower with Dial Antibacterial Soap the night before surgery and the morning of surgery prior to arrival.  __X__ Stop Anti-inflammatories 7 days before surgery such as Advil, Ibuprofen, Motrin, BC or Goodies  Powder, Naprosyn, Naproxen, Aleve, Aspirin, Meloxicam. May take Tylenol if needed for pain or discomfort.   __X__Do not start taking any new herbal supplements or vitamins prior to your procedure.     Wear comfortable clothing (specific to your surgery type) to the hospital.  Plan for stool softeners for home use; pain medications have a tendency to cause constipation. You can also help prevent constipation by eating foods high in fiber such as fruits and vegetables and drinking plenty of fluids as your diet allows.  After surgery, you can prevent lung complications by doing breathing exercises.Take deep breaths and cough every 1-2 hours. Your doctor may order a device called an Incentive Spirometer to help you take deep breaths.  Please call the Pre-Admissions Testing Department at [phone] if you have any questions about these instructions

## 2020-01-02 ENCOUNTER — Other Ambulatory Visit: Payer: Self-pay

## 2020-01-02 ENCOUNTER — Ambulatory Visit: Payer: Commercial Managed Care - PPO | Admitting: Certified Registered Nurse Anesthetist

## 2020-01-02 ENCOUNTER — Ambulatory Visit
Admission: RE | Admit: 2020-01-02 | Discharge: 2020-01-02 | Disposition: A | Discharge status: Home / Self Care | Payer: Commercial Managed Care - PPO | Attending: Surgery | Admitting: Surgery

## 2020-01-02 ENCOUNTER — Encounter
Admission: RE | Disposition: A | Discharge status: Home / Self Care | Payer: Self-pay | Source: Home / Self Care | Attending: Surgery

## 2020-01-02 ENCOUNTER — Encounter: Payer: Self-pay | Admitting: Surgery

## 2020-01-02 DIAGNOSIS — Z6830 Body mass index (BMI) 30.0-30.9, adult: Secondary | ICD-10-CM | POA: Insufficient documentation

## 2020-01-02 DIAGNOSIS — K8010 Calculus of gallbladder with chronic cholecystitis without obstruction: Secondary | ICD-10-CM | POA: Insufficient documentation

## 2020-01-02 DIAGNOSIS — Z9884 Bariatric surgery status: Secondary | ICD-10-CM | POA: Insufficient documentation

## 2020-01-02 DIAGNOSIS — Z79899 Other long term (current) drug therapy: Secondary | ICD-10-CM | POA: Diagnosis not present

## 2020-01-02 DIAGNOSIS — K811 Chronic cholecystitis: Secondary | ICD-10-CM

## 2020-01-02 DIAGNOSIS — K219 Gastro-esophageal reflux disease without esophagitis: Secondary | ICD-10-CM | POA: Insufficient documentation

## 2020-01-02 LAB — POCT PREGNANCY, URINE: Preg Test, Ur: NEGATIVE

## 2020-01-02 SURGERY — CHOLECYSTECTOMY, ROBOT-ASSISTED, LAPAROSCOPIC
Anesthesia: General | Site: Abdomen

## 2020-01-02 MED ORDER — PROMETHAZINE HCL 25 MG/ML IJ SOLN: 6.2500 mg | INTRAMUSCULAR | Status: DC | PRN

## 2020-01-02 MED ORDER — ROCURONIUM BROMIDE 100 MG/10ML IV SOLN
INTRAVENOUS | Status: DC | PRN
  Administered 2020-01-02: 5 mg via INTRAVENOUS
  Administered 2020-01-02: 50 mg via INTRAVENOUS
  Administered 2020-01-02: 20 mg via INTRAVENOUS

## 2020-01-02 MED ORDER — LIDOCAINE-EPINEPHRINE (PF) 1 %-1:200000 IJ SOLN
INTRAMUSCULAR | Status: DC | PRN
  Administered 2020-01-02: 9 mL via INTRAMUSCULAR
  Administered 2020-01-02: 19 mL via INTRAMUSCULAR

## 2020-01-02 MED ORDER — HYDROCODONE-ACETAMINOPHEN 5-325 MG PO TABS
1.0000 | ORAL_TABLET | Freq: Four times a day (QID) | ORAL | 0 refills | Status: DC | PRN
Start: 2020-01-02 — End: 2024-03-30

## 2020-01-02 MED ORDER — FENTANYL CITRATE (PF) 100 MCG/2ML IJ SOLN
INTRAMUSCULAR | Status: AC
  Administered 2020-01-02: 25 ug via INTRAVENOUS
  Filled 2020-01-02: qty 2

## 2020-01-02 MED ORDER — ACETAMINOPHEN 325 MG PO TABS: 650.0000 mg | ORAL_TABLET | Freq: Three times a day (TID) | ORAL | 0 refills | Status: AC | PRN

## 2020-01-02 MED ORDER — LACTATED RINGERS IV SOLN: INTRAVENOUS | Status: DC

## 2020-01-02 MED ORDER — ONDANSETRON HCL 4 MG/2ML IJ SOLN: 4.0000 mg | Freq: Once | INTRAMUSCULAR | Status: AC

## 2020-01-02 MED ORDER — MIDAZOLAM HCL 2 MG/2ML IJ SOLN
INTRAMUSCULAR | Status: DC | PRN
  Administered 2020-01-02: 2 mg via INTRAVENOUS

## 2020-01-02 MED ORDER — FENTANYL CITRATE (PF) 100 MCG/2ML IJ SOLN
INTRAMUSCULAR | Status: DC | PRN
Start: 2020-01-02 — End: 2020-01-02
  Administered 2020-01-02 (×2): 50 ug via INTRAVENOUS

## 2020-01-02 MED ORDER — ONDANSETRON HCL 4 MG/2ML IJ SOLN
INTRAMUSCULAR | Status: AC
  Filled 2020-01-02: qty 2

## 2020-01-02 MED ORDER — LIDOCAINE HCL (CARDIAC) PF 100 MG/5ML IV SOSY
PREFILLED_SYRINGE | INTRAVENOUS | Status: DC | PRN
  Administered 2020-01-02: 100 mg via INTRAVENOUS

## 2020-01-02 MED ORDER — CHLORHEXIDINE GLUCONATE 0.12 % MT SOLN
OROMUCOSAL | Status: AC
  Filled 2020-01-02: qty 15

## 2020-01-02 MED ORDER — FAMOTIDINE 20 MG PO TABS
ORAL_TABLET | ORAL | Status: AC
  Administered 2020-01-02: 20 mg via ORAL
  Filled 2020-01-02: qty 1

## 2020-01-02 MED ORDER — FENTANYL CITRATE (PF) 100 MCG/2ML IJ SOLN
INTRAMUSCULAR | Status: AC
  Filled 2020-01-02: qty 2

## 2020-01-02 MED ORDER — GABAPENTIN 300 MG PO CAPS
ORAL_CAPSULE | ORAL | Status: AC
  Administered 2020-01-02: 300 mg via ORAL
  Filled 2020-01-02: qty 1

## 2020-01-02 MED ORDER — ACETAMINOPHEN 500 MG PO TABS
ORAL_TABLET | ORAL | Status: AC
  Administered 2020-01-02: 1000 mg via ORAL
  Filled 2020-01-02: qty 2

## 2020-01-02 MED ORDER — CHLORHEXIDINE GLUCONATE CLOTH 2 % EX PADS: 6.0000 | MEDICATED_PAD | Freq: Once | CUTANEOUS | Status: DC

## 2020-01-02 MED ORDER — MEPERIDINE HCL 50 MG/ML IJ SOLN: 6.2500 mg | INTRAMUSCULAR | Status: DC | PRN

## 2020-01-02 MED ORDER — ACETAMINOPHEN 500 MG PO TABS: 1000.0000 mg | ORAL_TABLET | ORAL | Status: AC

## 2020-01-02 MED ORDER — LIDOCAINE HCL (PF) 2 % IJ SOLN
INTRAMUSCULAR | Status: AC
  Filled 2020-01-02: qty 5

## 2020-01-02 MED ORDER — FAMOTIDINE 20 MG PO TABS: 20.0000 mg | ORAL_TABLET | Freq: Once | ORAL | Status: AC

## 2020-01-02 MED ORDER — OXYCODONE HCL 5 MG PO TABS
ORAL_TABLET | ORAL | Status: AC
  Filled 2020-01-02: qty 1

## 2020-01-02 MED ORDER — SODIUM CHLORIDE FLUSH 0.9 % IV SOLN
INTRAVENOUS | Status: AC
  Filled 2020-01-02: qty 20

## 2020-01-02 MED ORDER — INDOCYANINE GREEN 25 MG IV SOLR
1.2500 mg | Freq: Once | INTRAVENOUS | Status: AC
  Administered 2020-01-02: 1.25 mg via INTRAVENOUS
  Filled 2020-01-02: qty 10
  Filled 2020-01-02: qty 0.5

## 2020-01-02 MED ORDER — CHLORHEXIDINE GLUCONATE 0.12 % MT SOLN: 15.0000 mL | Freq: Once | OROMUCOSAL | Status: DC

## 2020-01-02 MED ORDER — ONDANSETRON HCL 4 MG/2ML IJ SOLN
INTRAMUSCULAR | Status: AC
  Administered 2020-01-02: 4 mg via INTRAVENOUS
  Filled 2020-01-02: qty 2

## 2020-01-02 MED ORDER — LIDOCAINE-EPINEPHRINE (PF) 1 %-1:200000 IJ SOLN
INTRAMUSCULAR | Status: AC
  Filled 2020-01-02: qty 30

## 2020-01-02 MED ORDER — ONDANSETRON HCL 4 MG/2ML IJ SOLN
INTRAMUSCULAR | Status: DC | PRN
  Administered 2020-01-02: 4 mg via INTRAVENOUS

## 2020-01-02 MED ORDER — GABAPENTIN 300 MG PO CAPS: 300.0000 mg | ORAL_CAPSULE | ORAL | Status: AC

## 2020-01-02 MED ORDER — OXYCODONE HCL 5 MG PO TABS
5.0000 mg | ORAL_TABLET | Freq: Once | ORAL | Status: AC | PRN
  Administered 2020-01-02: 5 mg via ORAL

## 2020-01-02 MED ORDER — BUPIVACAINE HCL (PF) 0.5 % IJ SOLN
INTRAMUSCULAR | Status: AC
  Filled 2020-01-02: qty 30

## 2020-01-02 MED ORDER — FENTANYL CITRATE (PF) 100 MCG/2ML IJ SOLN
25.0000 ug | INTRAMUSCULAR | Status: DC | PRN
  Administered 2020-01-02 (×3): 25 ug via INTRAVENOUS

## 2020-01-02 MED ORDER — DEXAMETHASONE SODIUM PHOSPHATE 10 MG/ML IJ SOLN
INTRAMUSCULAR | Status: DC | PRN
  Administered 2020-01-02: 10 mg via INTRAVENOUS

## 2020-01-02 MED ORDER — PROPOFOL 10 MG/ML IV BOLUS
INTRAVENOUS | Status: AC
  Filled 2020-01-02: qty 20

## 2020-01-02 MED ORDER — PROPOFOL 10 MG/ML IV BOLUS
INTRAVENOUS | Status: DC | PRN
  Administered 2020-01-02: 170 mg via INTRAVENOUS

## 2020-01-02 MED ORDER — SUGAMMADEX SODIUM 200 MG/2ML IV SOLN
INTRAVENOUS | Status: DC | PRN
  Administered 2020-01-02: 200 mg via INTRAVENOUS

## 2020-01-02 MED ORDER — DOCUSATE SODIUM 100 MG PO CAPS: 100.0000 mg | ORAL_CAPSULE | Freq: Two times a day (BID) | ORAL | 0 refills | Status: AC | PRN

## 2020-01-02 MED ORDER — OXYCODONE HCL 5 MG/5ML PO SOLN: 5.0000 mg | Freq: Once | ORAL | Status: AC | PRN

## 2020-01-02 MED ORDER — MIDAZOLAM HCL 2 MG/2ML IJ SOLN
INTRAMUSCULAR | Status: AC
  Filled 2020-01-02: qty 2

## 2020-01-02 MED ORDER — ORAL CARE MOUTH RINSE: 15.0000 mL | Freq: Once | OROMUCOSAL | Status: DC

## 2020-01-02 MED ORDER — CEFAZOLIN SODIUM-DEXTROSE 2-4 GM/100ML-% IV SOLN
INTRAVENOUS | Status: AC
  Filled 2020-01-02: qty 100

## 2020-01-02 MED ORDER — CEFAZOLIN SODIUM-DEXTROSE 2-4 GM/100ML-% IV SOLN
2.0000 g | INTRAVENOUS | Status: AC
  Administered 2020-01-02: 2 g via INTRAVENOUS

## 2020-01-02 SURGICAL SUPPLY — 59 items
ADH SKN CLS APL DERMABOND .7 (GAUZE/BANDAGES/DRESSINGS) ×1
ANCHOR TIS RET SYS 235ML (MISCELLANEOUS) ×3 IMPLANT
APL PRP STRL LF DISP 70% ISPRP (MISCELLANEOUS) ×1
BAG INFUSER PRESSURE 100CC (MISCELLANEOUS) IMPLANT
BAG TISS RTRVL C235 10X14 (MISCELLANEOUS) ×1
BLADE SURG SZ11 CARB STEEL (BLADE) ×3 IMPLANT
CANISTER SUCT 1200ML W/VALVE (MISCELLANEOUS) ×3 IMPLANT
CANNULA REDUC XI 12-8 STAPL (CANNULA) ×1
CANNULA REDUC XI 12-8MM STAPL (CANNULA) ×1
CANNULA REDUCER 12-8 DVNC XI (CANNULA) ×1 IMPLANT
CHLORAPREP W/TINT 26 (MISCELLANEOUS) ×3 IMPLANT
CLIP VESOLOCK MED LG 6/CT (CLIP) ×3 IMPLANT
COVER TIP SHEARS 8 DVNC (MISCELLANEOUS) ×1 IMPLANT
COVER TIP SHEARS 8MM DA VINCI (MISCELLANEOUS) ×2
COVER WAND RF STERILE (DRAPES) ×3 IMPLANT
DECANTER SPIKE VIAL GLASS SM (MISCELLANEOUS) ×6 IMPLANT
DEFOGGER SCOPE WARMER CLEARIFY (MISCELLANEOUS) ×3 IMPLANT
DERMABOND ADVANCED (GAUZE/BANDAGES/DRESSINGS) ×2
DERMABOND ADVANCED .7 DNX12 (GAUZE/BANDAGES/DRESSINGS) ×1 IMPLANT
DRAPE ARM DVNC X/XI (DISPOSABLE) ×4 IMPLANT
DRAPE COLUMN DVNC XI (DISPOSABLE) ×1 IMPLANT
DRAPE DA VINCI XI ARM (DISPOSABLE) ×8
DRAPE DA VINCI XI COLUMN (DISPOSABLE) ×2
ELECT CAUTERY BLADE 6.4 (BLADE) ×3 IMPLANT
ELECT REM PT RETURN 9FT ADLT (ELECTROSURGICAL) ×3
ELECTRODE REM PT RTRN 9FT ADLT (ELECTROSURGICAL) ×1 IMPLANT
GLOVE BIOGEL PI IND STRL 7.0 (GLOVE) ×2 IMPLANT
GLOVE BIOGEL PI INDICATOR 7.0 (GLOVE) ×4
GLOVE SURG SYN 6.5 ES PF (GLOVE) ×6 IMPLANT
GOWN STRL REUS W/ TWL LRG LVL3 (GOWN DISPOSABLE) ×3 IMPLANT
GOWN STRL REUS W/TWL LRG LVL3 (GOWN DISPOSABLE) ×9
GRASPER SUT TROCAR 14GX15 (MISCELLANEOUS) IMPLANT
HANDLE YANKAUER SUCT BULB TIP (MISCELLANEOUS) ×3 IMPLANT
IRRIGATOR SUCT 8 DISP DVNC XI (IRRIGATION / IRRIGATOR) IMPLANT
IRRIGATOR SUCTION 8MM XI DISP (IRRIGATION / IRRIGATOR)
IV NS 1000ML (IV SOLUTION)
IV NS 1000ML BAXH (IV SOLUTION) IMPLANT
LABEL OR SOLS (LABEL) ×3 IMPLANT
NEEDLE HYPO 22GX1.5 SAFETY (NEEDLE) ×3 IMPLANT
NEEDLE INSUFFLATION 14GA 120MM (NEEDLE) ×3 IMPLANT
NS IRRIG 500ML POUR BTL (IV SOLUTION) ×3 IMPLANT
OBTURATOR OPTICAL STANDARD 8MM (TROCAR) ×2
OBTURATOR OPTICAL STND 8 DVNC (TROCAR) ×1
OBTURATOR OPTICALSTD 8 DVNC (TROCAR) ×1 IMPLANT
PACK LAP CHOLECYSTECTOMY (MISCELLANEOUS) ×3 IMPLANT
PENCIL ELECTRO HAND CTR (MISCELLANEOUS) ×3 IMPLANT
SEAL CANN UNIV 5-8 DVNC XI (MISCELLANEOUS) ×3 IMPLANT
SEAL XI 5MM-8MM UNIVERSAL (MISCELLANEOUS) ×6
SET TUBE SMOKE EVAC HIGH FLOW (TUBING) ×3 IMPLANT
SOLUTION ELECTROLUBE (MISCELLANEOUS) ×3 IMPLANT
STAPLER CANNULA SEAL DVNC XI (STAPLE) ×1 IMPLANT
STAPLER CANNULA SEAL XI (STAPLE) ×2
SUT MNCRL 4-0 (SUTURE) ×6
SUT MNCRL 4-0 27XMFL (SUTURE) ×2
SUT MNCRL AB 4-0 PS2 18 (SUTURE) ×3 IMPLANT
SUT VICRYL 0 AB UR-6 (SUTURE) ×3 IMPLANT
SUTURE MNCRL 4-0 27XMF (SUTURE) ×2 IMPLANT
TUBING CONNECTING 10 (TUBING) ×2 IMPLANT
TUBING CONNECTING 10' (TUBING) ×1

## 2020-01-02 NOTE — Anesthesia Procedure Notes (Signed)
Procedure Name: Intubation Date/Time: 01/02/2020 1:51 PM Performed by: Pearla Dubonnet, CRNA Pre-anesthesia Checklist: Patient identified, Patient being monitored, Timeout performed, Emergency Drugs available and Suction available Patient Re-evaluated:Patient Re-evaluated prior to induction Oxygen Delivery Method: Circle system utilized Preoxygenation: Pre-oxygenation with 100% oxygen Induction Type: IV induction Ventilation: Mask ventilation without difficulty Laryngoscope Size: 3 and McGraph Grade View: Grade I Tube type: Oral Tube size: 7.0 mm Number of attempts: 1 Airway Equipment and Method: Stylet Placement Confirmation: ETT inserted through vocal cords under direct vision,  positive ETCO2 and breath sounds checked- equal and bilateral Secured at: 21 cm Tube secured with: Tape Dental Injury: Teeth and Oropharynx as per pre-operative assessment

## 2020-01-02 NOTE — Op Note (Signed)
Preoperative diagnosis:  chronic and cholecystitis  Postoperative diagnosis: same as above  Procedure: Robotic assisted Laparoscopic Cholecystectomy.   Anesthesia: GETA   Surgeon: Sung Amabile  Specimen: Gallbladder  Complications: None  EBL: 65mL  Wound Classification: Clean Contaminated  Indications: see HPI  Findings: Critical view of safety noted Cystic duct and artery identified, ligated and divided, clips remained intact at end of procedure Adequate hemostasis  Description of procedure:  The patient was placed on the operating table in the supine position. SCDs placed, pre-op abx administered.  General anesthesia was induced and OG tube placed by anesthesia. A time-out was completed verifying correct patient, procedure, site, positioning, and implant(s) and/or special equipment prior to beginning this procedure. The abdomen was prepped and draped in the usual sterile fashion.    Veress needle was placed at the Palmer's point and insufflation was started after confirming a positive saline drop test and no immediate increase in abdominal pressure.  After reaching 15 mm, the Veress needle was removed and a 8 mm port was placed via optiview technique under umbilicus measured 4mm from gallbladder.  The abdomen was inspected and no abnormalities or injuries were found.  Under direct vision, ports were placed in the following locations: One 12 mm patient left of the umbilicus, 8cm from the optiviewed port, one 8 mm port placed to the patient right of the umbilical port 8 cm apart.  1 additional 8 mm port placed lateral to the 35mm port.  Once ports were placed, The table was placed in the reverse Trendelenburg position with the right side up. The Xi platform was brought into the operative field and docked to the ports successfully.  An endoscope was placed through the umbilical port, fenestrated grasper through the adjacent patient right port, prograsp to the far patient left port, and  then a hook cautery in the left port.  The dome of the gallbladder was grasped with prograsp, passed and retracted over the dome of the liver. Adhesions between the gallbladder and omentum, duodenum and transverse colon were lysed via hook cautery. The infundibulum was grasped with the fenestrated grasper and retracted toward the right lower quadrant. This maneuver exposed Calot's triangle. The peritoneum overlying the gallbladder infundibulum was then dissected using combination of Maryland dissector and electrocautery hook and the cystic duct and cystic artery identified.  Critical view of safety with the liver bed clearly visible behind the duct and artery with no additional structures noted.  The cystic duct and cystic artery clipped and divided close to the gallbladder.     The gallbladder was then dissected from its peritoneal and liver bed attachments by electrocautery. Hemostasis was checked prior to removing the hook cautery and the Endo Catch bag was then placed through the 12 mm port and the gallbladder was removed.  The gallbladder was passed off the table as a specimen. active bleeding was noted from 66mm port site so incision had to be extended to visualize bleeding vessel that was suture ligated by taking bite of surrouding tissue when closing fascial defect with 0 vicryl.  After port site closed, abdomen inspected with camera again. There was no evidence of bleeding from the gallbladder fossa or cystic artery or leakage of the bile from the cystic duct stump. Excess blood pooling in dependen portions from port site bleed suctioned out. Abdomen desufflated and secondary trocars were removed under direct vision. No bleeding was noted. All skin incisions then closed with subcuticular sutures of 4-0 monocryl and dressed with topical skin  adhesive.   The patient tolerated the procedure well and was taken to the postanesthesia care unit in stable condition.  All sponge and instrument count correct at  end of procedure.

## 2020-01-02 NOTE — Interval H&P Note (Signed)
History and Physical Interval Note:  01/02/2020 12:41 PM  Vanessa Pratt  has presented today for surgery, with the diagnosis of K80.50 Biliary colic.  The various methods of treatment have been discussed with the patient and family. After consideration of risks, benefits and other options for treatment, the patient has consented to  Procedure(s): XI ROBOTIC ASSISTED LAPAROSCOPIC CHOLECYSTECTOMY (N/A) as a surgical intervention.  The patient's history has been reviewed, patient examined, no change in status, stable for surgery.  I have reviewed the patient's chart and labs.  Questions were answered to the patient's satisfaction.     Conley Delisle Tonna Boehringer

## 2020-01-02 NOTE — Discharge Instructions (Signed)
AMBULATORY SURGERY  DISCHARGE INSTRUCTIONS   1) The drugs that you were given will stay in your system until tomorrow so for the next 24 hours you should not:  A) Drive an automobile B) Make any legal decisions C) Drink any alcoholic beverage   2) You may resume regular meals tomorrow.  Today it is better to start with liquids and gradually work up to solid foods.  You may eat anything you prefer, but it is better to start with liquids, then soup and crackers, and gradually work up to solid foods.   3) Please notify your doctor immediately if you have any unusual bleeding, trouble breathing, redness and pain at the surgery site, drainage, fever, or pain not relieved by medication. 4)   5) Your post-operative visit with Dr.                                     is: Date:                        Time:    Please call to schedule your post-operative visit.  6) Additional Instructions:    Laparoscopic Cholecystectomy, Care After This sheet gives you information about how to care for yourself after your procedure. Your doctor may also give you more specific instructions. If you have problems or questions, contact your doctor. Follow these instructions at home: Care for cuts from surgery (incisions)   Follow instructions from your doctor about how to take care of your cuts from surgery. Make sure you: ? Wash your hands with soap and water before you change your bandage (dressing). If you cannot use soap and water, use hand sanitizer. ? Change your bandage as told by your doctor. ? Leave stitches (sutures), skin glue, or skin tape (adhesive) strips in place. They may need to stay in place for 2 weeks or longer. If tape strips get loose and curl up, you may trim the loose edges. Do not remove tape strips completely unless your doctor says it is okay.  Do not take baths, swim, or use a hot tub until your doctor says it is okay. OK TO SHOWER 24HRS AFTER YOUR SURGERY.   Check your surgical  cut area every day for signs of infection. Check for: ? More redness, swelling, or pain. ? More fluid or blood. ? Warmth. ? Pus or a bad smell. Activity  Do not drive or use heavy machinery while taking prescription pain medicine.  Do not play contact sports until your doctor says it is okay.  Do not drive for 24 hours if you were given a medicine to help you relax (sedative).  Rest as needed. Do not return to work or school until your doctor says it is okay. General instructions .  tylenol and advil as needed for discomfort.  Please alternate between the two every four hours as needed for pain.   .  Use narcotics, if prescribed, only when tylenol and motrin is not enough to control pain. .  325-650mg  every 8hrs to max of 3000mg /24hrs (including the 325mg  in every norco dose) for the tylenol.   .  Advil up to 800mg  per dose every 8hrs as needed for pain.    To prevent or treat constipation while you are taking prescription pain medicine, your doctor may recommend that you: ? Drink enough fluid to keep your pee (urine) clear or  pale yellow. ? Take over-the-counter or prescription medicines. ? Eat foods that are high in fiber, such as fresh fruits and vegetables, whole grains, and beans. ? Limit foods that are high in fat and processed sugars, such as fried and sweet foods. Contact a doctor if:  You develop a rash.  You have more redness, swelling, or pain around your surgical cuts.  You have more fluid or blood coming from your surgical cuts.  Your surgical cuts feel warm to the touch.  You have pus or a bad smell coming from your surgical cuts.  You have a fever.  One or more of your surgical cuts breaks open. Get help right away if:  You have trouble breathing.  You have chest pain.  You have pain that is getting worse in your shoulders.  You faint or feel dizzy when you stand.  You have very bad pain in your belly (abdomen).  You are sick to your stomach  (nauseous) for more than one day.  You have throwing up (vomiting) that lasts for more than one day.  You have leg pain. This information is not intended to replace advice given to you by your health care provider. Make sure you discuss any questions you have with your health care provider. Document Released: 01/29/2008 Document Revised: 11/10/2015 Document Reviewed: 10/08/2015 Elsevier Interactive Patient Education  2019 [employer].

## 2020-01-02 NOTE — Transfer of Care (Signed)
Immediate Anesthesia Transfer of Care Note  Patient: Vanessa Pratt  Procedure(s) Performed: XI ROBOTIC ASSISTED LAPAROSCOPIC CHOLECYSTECTOMY (N/A Abdomen)  Patient Location: PACU  Anesthesia Type:General  Level of Consciousness: drowsy  Airway & Oxygen Therapy: Patient Spontanous Breathing and Patient connected to face mask oxygen  Post-op Assessment: Report given to RN and Post -op Vital signs reviewed and stable  Post vital signs: Reviewed and stable  Last Vitals:  Vitals Value Taken Time  BP 121/67 01/02/20 1520  Temp 36 C 01/02/20 1520  Pulse 55 01/02/20 1524  Resp 24 01/02/20 1524  SpO2 100 % 01/02/20 1524  Vitals shown include unvalidated device data.  Last Pain:  Vitals:   01/02/20 1520  TempSrc:   PainSc: Asleep         Complications: No complications documented.

## 2020-01-02 NOTE — Anesthesia Preprocedure Evaluation (Signed)
Anesthesia Evaluation  Patient identified by MRN, date of birth, ID band Patient awake    Reviewed: Allergy & Precautions, NPO status , Patient's Chart, lab work & pertinent test results  History of Anesthesia Complications Negative for: history of anesthetic complications  Airway Mallampati: I  TM Distance: >3 FB Neck ROM: Full    Dental no notable dental hx.    Pulmonary neg pulmonary ROS, neg sleep apnea, neg COPD,    breath sounds clear to auscultation- rhonchi (-) wheezing      Cardiovascular (-) hypertension(-) CAD, (-) Past MI, (-) Cardiac Stents and (-) CABG  Rhythm:Regular Rate:Normal - Systolic murmurs and - Diastolic murmurs    Neuro/Psych neg Seizures negative neurological ROS  negative psych ROS   GI/Hepatic negative GI ROS, Neg liver ROS,   Endo/Other  neg diabetes  Renal/GU negative Renal ROS     Musculoskeletal negative musculoskeletal ROS (+)   Abdominal (+) + obese,   Peds  Hematology  (+) anemia ,   Anesthesia Other Findings Past Medical History: 12/2011: Fall at home No date: Gestational diabetes     Comment:  glyburide No date: H/O varicella No date: Hypertension     Comment:  gestational 03/02/2012: Maternal anemia complicating pregnancy, childbirth, or  the puerperium 03/02/2012: NSVD (normal spontaneous vaginal delivery - 10/28) No date: Obese 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 03/02/2012: Postpartum care following vaginal delivery No date: Unspecified local infection of skin and subcutaneous tissue   Reproductive/Obstetrics                             Anesthesia Physical Anesthesia Plan  ASA: II  Anesthesia Plan: General   Post-op Pain Management:    Induction: Intravenous  PONV Risk Score and Plan: 2 and Ondansetron, Dexamethasone and Midazolam  Airway Management Planned: Oral ETT  Additional Equipment:    Intra-op Plan:   Post-operative Plan: Extubation in OR  Informed Consent: I have reviewed the patients History and Physical, chart, labs and discussed the procedure including the risks, benefits and alternatives for the proposed anesthesia with the patient or authorized representative who has indicated his/her understanding and acceptance.     Dental advisory given  Plan Discussed with: CRNA and Anesthesiologist  Anesthesia Plan Comments:         Anesthesia Quick Evaluation

## 2020-01-04 LAB — SURGICAL PATHOLOGY

## 2020-01-11 NOTE — Anesthesia Postprocedure Evaluation (Signed)
Anesthesia Post Note  Patient: Vanessa Pratt  Procedure(s) Performed: XI ROBOTIC ASSISTED LAPAROSCOPIC CHOLECYSTECTOMY (N/A Abdomen)  Patient location during evaluation: PACU Anesthesia Type: General Level of consciousness: awake and alert Pain management: pain level controlled Vital Signs Assessment: post-procedure vital signs reviewed and stable Respiratory status: spontaneous breathing, nonlabored ventilation, respiratory function stable and patient connected to nasal cannula oxygen Cardiovascular status: blood pressure returned to baseline and stable Postop Assessment: no apparent nausea or vomiting Anesthetic complications: no   No complications documented.   Last Vitals:  Vitals:   01/02/20 1638 01/02/20 1724  BP: 124/74 (P) 123/65  Pulse: (!) 55 (!) (P) 58  Resp: 18 (P) 18  Temp:    SpO2: 98% (P) 98%    Last Pain:  Vitals:   01/03/20 0845  TempSrc:   PainSc: 3                  Yevette Edwards

## 2021-04-29 IMAGING — US US ABDOMEN LIMITED
1 series · 14 of 25 positions shown · non-contrast
Comparison: None.

CLINICAL DATA: Upper abdominal pain with nausea and vomiting

EXAM:
ULTRASOUND ABDOMEN LIMITED RIGHT UPPER QUADRANT

[Series 1: us abdomen limited · 0.23mm/px · 14 of 53 slices shown]
[im 1/53]
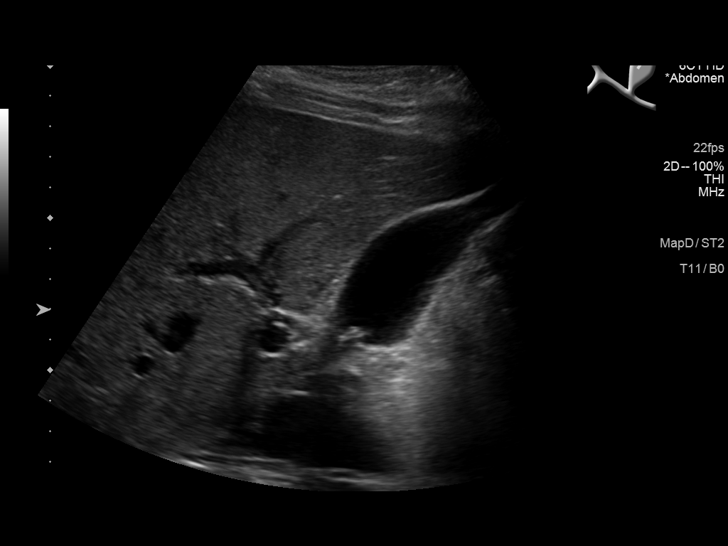
[im 5/53]
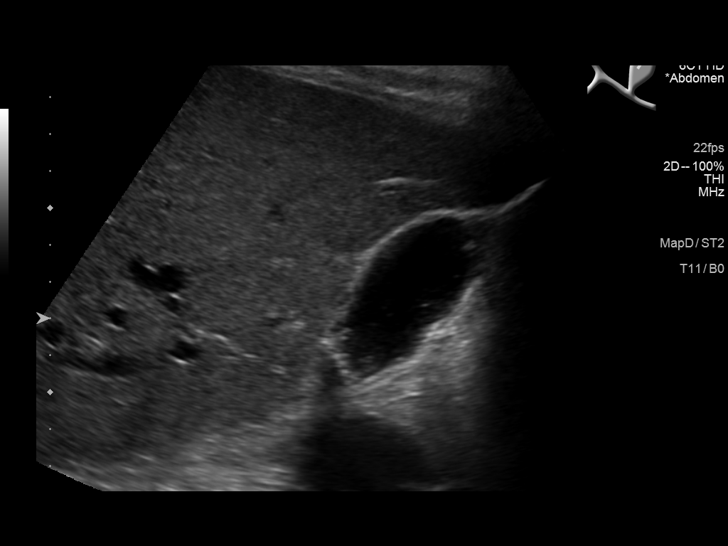
[im 9/53]
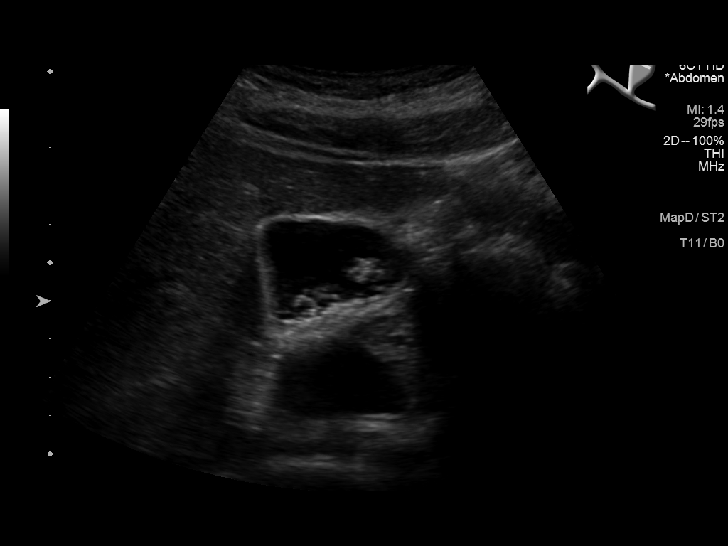
[im 14/53]
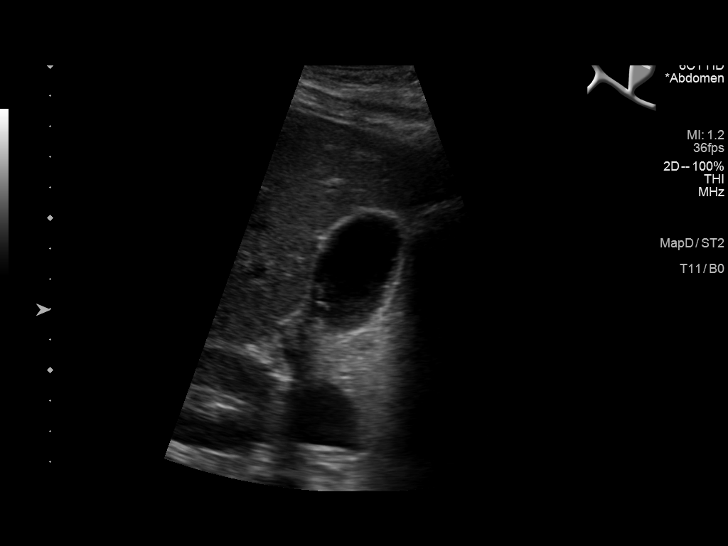
[im 18/53]
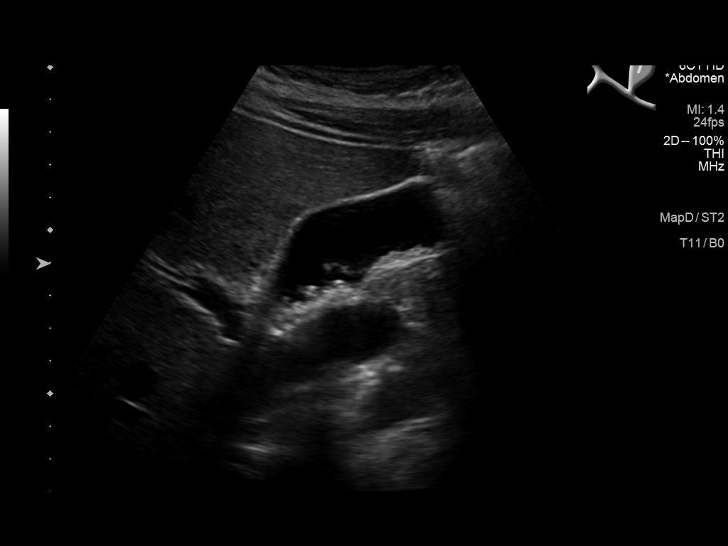
[im 20/53]
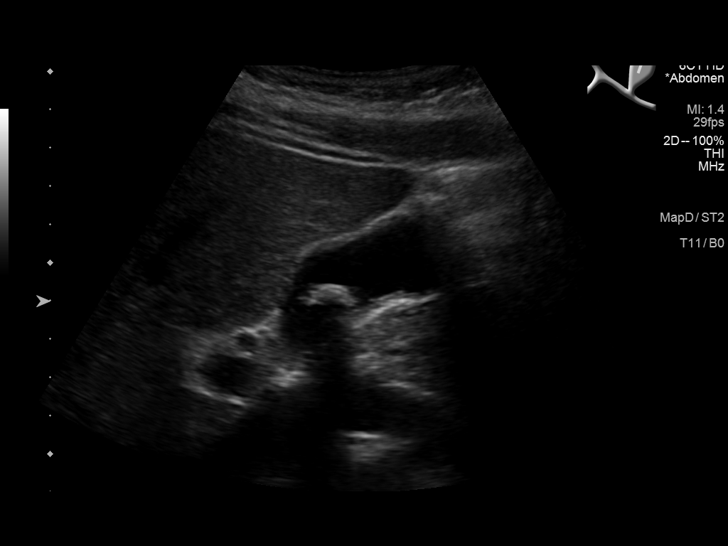
[im 24/53]
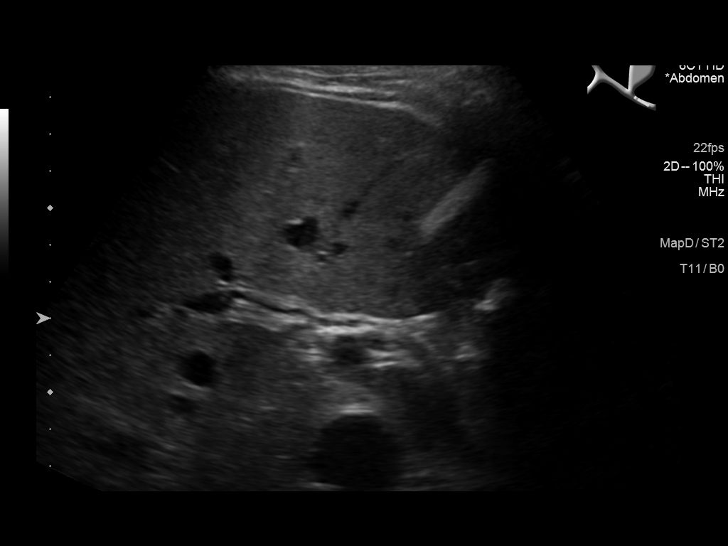
[im 29/53]
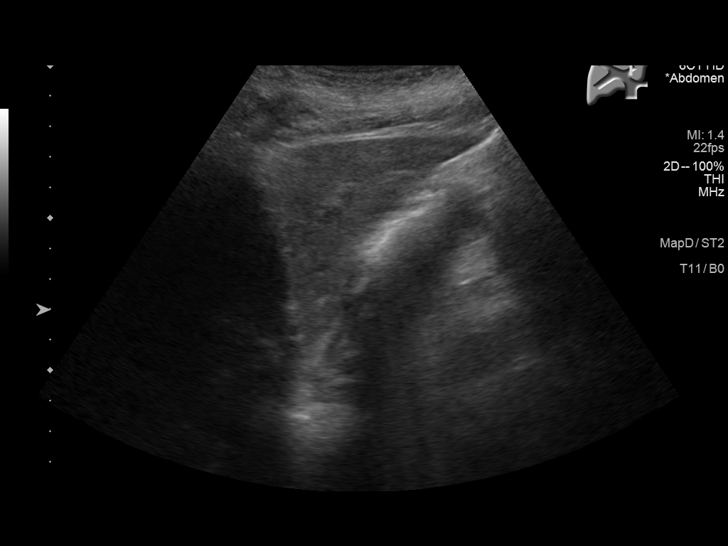
[im 33/53]
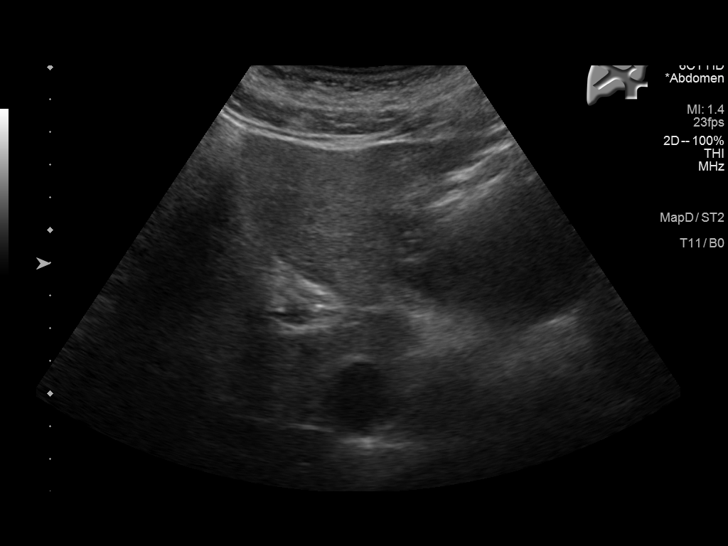
[im 35/53]
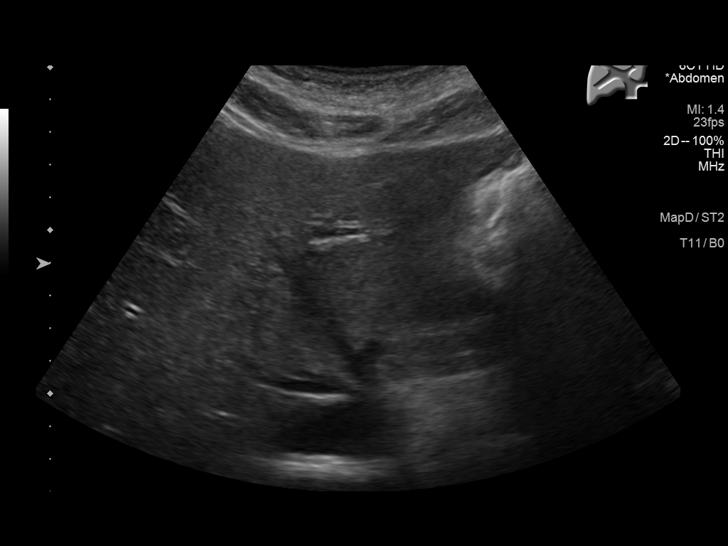
[im 40/53]
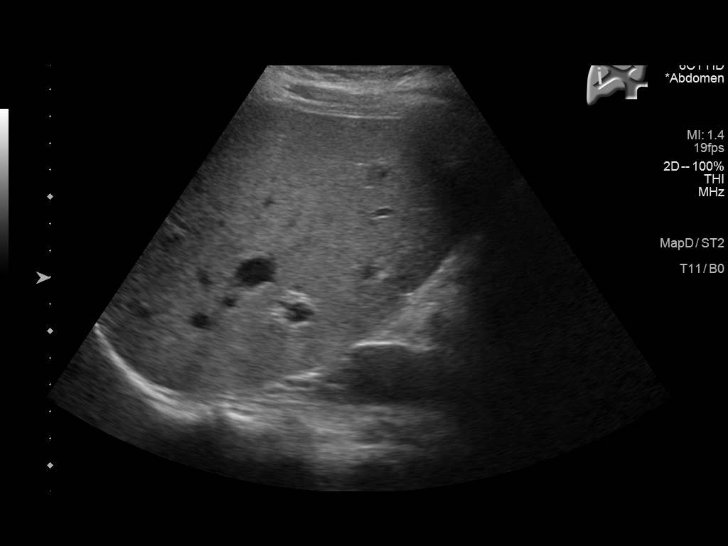
[im 44/53]
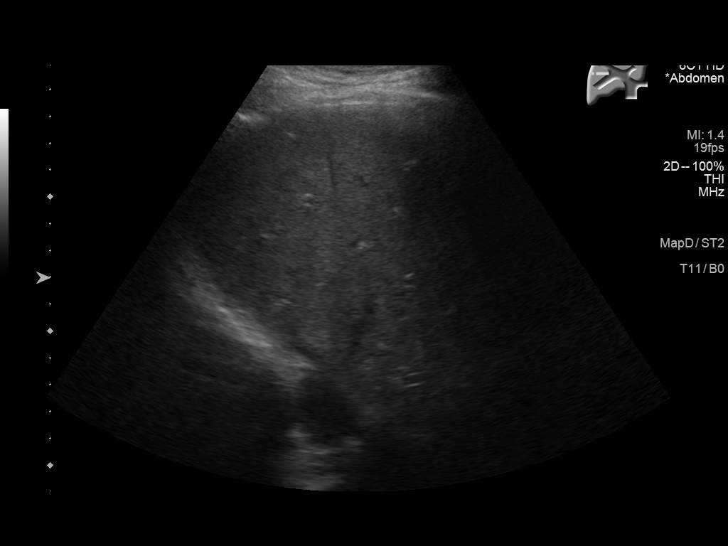
[im 48/53]
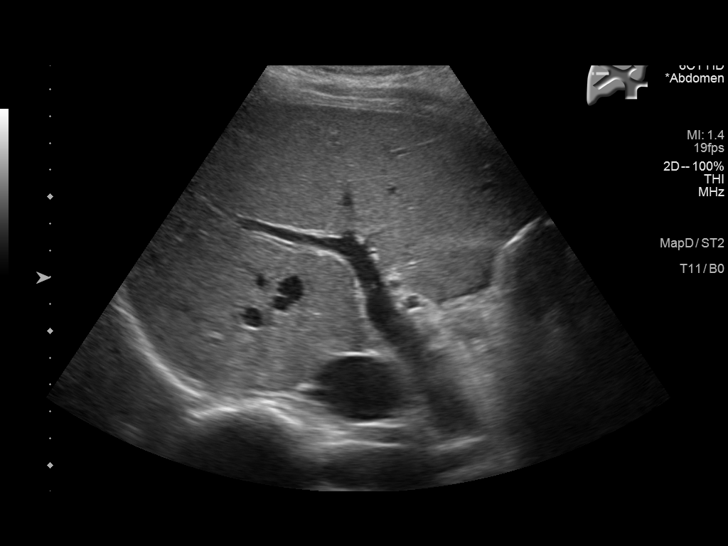
[im 53/53]
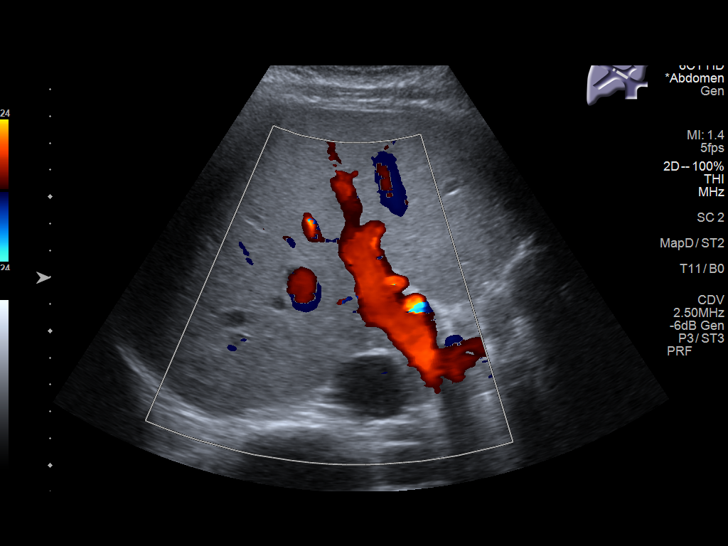

[14 of 25 positions shown; findings below may reference images not displayed]

FINDINGS: Gallbladder:

Within the gallbladder, there are echogenic foci which move and
shadow consistent with cholelithiasis. Largest gallstone measures
1.2 cm in length. Slight sludge is also noted in the gallbladder.
There is no gallbladder wall thickening or pericholecystic fluid. No
sonographic Murphy sign noted by sonographer.

Common bile duct:

Diameter: 4 mm. No intrahepatic or extrahepatic biliary duct
dilatation.

Liver:

No focal lesion identified. Within normal limits in parenchymal
echogenicity. Portal vein is patent on color Doppler imaging with
normal direction of blood flow towards the liver.

Other: None.
IMPRESSION: Cholelithiasis with mild intermingled sludge in the gallbladder. No
gallbladder wall thickening or pericholecystic fluid. Study
otherwise unremarkable.

## 2021-12-31 DIAGNOSIS — B179 Acute viral hepatitis, unspecified: Secondary | ICD-10-CM | POA: Insufficient documentation

## 2023-03-03 ENCOUNTER — Ambulatory Visit
Admission: RE | Admit: 2023-03-03 | Discharge: 2023-03-03 | Disposition: A | Discharge status: Home / Self Care | Payer: 59 | Source: Ambulatory Visit | Attending: Emergency Medicine | Admitting: Emergency Medicine

## 2023-03-03 VITALS — BP 137/76 | HR 99 | Temp 98.1°F | Resp 18

## 2023-03-03 DIAGNOSIS — J0100 Acute maxillary sinusitis, unspecified: Secondary | ICD-10-CM | POA: Diagnosis not present

## 2023-03-03 DIAGNOSIS — R051 Acute cough: Secondary | ICD-10-CM

## 2023-03-03 MED ORDER — AMOXICILLIN 875 MG PO TABS: 875.0000 mg | ORAL_TABLET | Freq: Two times a day (BID) | ORAL | 0 refills | Status: AC

## 2023-03-03 MED ORDER — BENZONATATE 100 MG PO CAPS: 100.0000 mg | ORAL_CAPSULE | Freq: Three times a day (TID) | ORAL | 0 refills | Status: DC | PRN

## 2023-03-03 NOTE — Discharge Instructions (Addendum)
Take the amoxicillin and Tessalon as directed.  Follow up with your primary care provider if your symptoms are not improving.    

## 2023-03-03 NOTE — ED Triage Notes (Addendum)
Patient to Urgent Care with complaints of dry cough/ fevers (max 102.6), temp last night 100, diarrhea, headaches/ body aches. Poor appetite.   Symptoms started 6 days ago. Using thera-flu/ Dayquil/ Nyquil/ tylenol/ Vicks.

## 2023-03-03 NOTE — ED Provider Notes (Signed)
Renaldo Fiddler    CSN: [id number] Arrival date & time: 03/03/23  0930      History   Chief Complaint Chief Complaint  Patient presents with   Cough    Entered by patient    HPI TALEEAH DAUGHETY is a 37 y.o. female.  Patient presents with 1 week history of fever, body aches, headache, congestion, cough, decreased appetite.  Multiple OTC treatments attempted without relief.  Tmax 102.6 several days ago; temp 100 last night.  No OTC medications taken today.  No chest pain, shortness of breath, or other symptoms.    The history is provided by the patient and medical records.    Past Medical History:  Diagnosis Date   Fall at home 12/2011   Gestational diabetes    glyburide   H/O varicella    Hypertension    gestational   Maternal anemia complicating pregnancy, childbirth, or the puerperium 03/02/2012   NSVD (normal spontaneous vaginal delivery - 10/28) 03/02/2012   Obese    Orbital pseudotumor    stable annual eye exams   Postpartum care following vaginal delivery 03/02/2012   Unspecified local infection of skin and subcutaneous tissue     Patient Active Problem List   Diagnosis Date Noted   NSVD (normal spontaneous vaginal delivery - 10/28) 03/02/2012   Postpartum care following vaginal delivery 03/02/2012   Maternal anemia complicating pregnancy, childbirth, or the puerperium 03/02/2012   Chronic hypertension complicating or reason for care during pregnancy 03/01/2012   Gestational diabetes mellitus, class A2 03/01/2012    Past Surgical History:  Procedure Laterality Date   BREAST ENHANCEMENT SURGERY     CHOLECYSTECTOMY     GASTRIC BYPASS  2020   MANDIBLE SURGERY     MASTOIDECTOMY     Left   TONSILLECTOMY      OB History     Gravida  2   Para  2   Term  1   Preterm      AB      Living  2      SAB      IAB      Ectopic      Multiple      Live Births  2            Home Medications    Prior to Admission medications    Medication Sig Start Date End Date Taking? Authorizing Provider  amoxicillin (AMOXIL) 875 MG tablet Take 1 tablet (875 mg total) by mouth 2 (two) times daily for 7 days. 03/03/23 03/10/23 Yes Mickie Bail, NP  benzonatate (TESSALON) 100 MG capsule Take 1 capsule (100 mg total) by mouth 3 (three) times daily as needed for cough. 03/03/23  Yes Mickie Bail, NP  Calcium Citrate-Vitamin D (CALCIUM + D PO) Take 1 tablet by mouth daily.    [provider]  cetirizine (ZYRTEC) 10 MG tablet Take 10 mg by mouth daily as needed for allergies. 07/05/18   [provider]  diphenhydrAMINE (BENADRYL) 25 MG tablet Take 25 mg by mouth at bedtime as needed. For allergies    [provider]  fluticasone (FLONASE) 50 MCG/ACT nasal spray Place 1 spray into both nostrils daily as needed for allergies. 06/04/17   [provider]  HYDROcodone-acetaminophen (NORCO) 5-325 MG tablet Take 1 tablet by mouth every 6 (six) hours as needed for up to 6 doses for moderate pain. Patient not taking: Reported on 03/03/2023 01/02/20   Sung Amabile, DO  Multiple Vitamin (MULTIVITAMIN WITH MINERALS) TABS tablet Take 1 tablet by mouth daily.    [provider]  polyethylene glycol powder (GLYCOLAX/MIRALAX) 17 GM/SCOOP powder Take 17 g by mouth daily as needed for constipation. 02/09/19   [provider]    Family History Family History  Problem Relation Age of Onset   Thrombocytopenia Mother    Cancer Father        pancreatic   Migraines Father    COPD Maternal Grandmother    Diabetes Maternal Grandfather    Cancer Maternal Grandfather        liver    Social History Social History   Tobacco Use   Smoking status: Never   Smokeless tobacco: Never  Vaping Use   Vaping status: Never Used  Substance Use Topics   Alcohol use: No   Drug use: No     Allergies   Prednisone, Tetracyclines & related, and Sulfa antibiotics   Review of Systems Review of Systems   Constitutional:  Positive for appetite change and fever.  HENT:  Positive for congestion. Negative for ear pain and sore throat.   Respiratory:  Positive for cough. Negative for shortness of breath.   Cardiovascular:  Negative for chest pain and palpitations.  Neurological:  Positive for headaches.     Physical Exam Triage Vital Signs ED Triage Vitals  Encounter Vitals Group     BP 03/03/23 0959 137/76     Systolic BP Percentile --      Diastolic BP Percentile --      Pulse Rate 03/03/23 0959 99     Resp 03/03/23 0959 18     Temp 03/03/23 0959 98.1 F (36.7 C)     Temp src --      SpO2 03/03/23 0959 96 %     Weight --      Height --      Head Circumference --      Peak Flow --      Pain Score 03/03/23 0956 2     Pain Loc --      Pain Education --      Exclude from Growth Chart --    No data found.  Updated Vital Signs BP 137/76   Pulse 99   Temp 98.1 F (36.7 C)   Resp 18   SpO2 96%   Visual Acuity Right Eye Distance:   Left Eye Distance:   Bilateral Distance:    Right Eye Near:   Left Eye Near:    Bilateral Near:     Physical Exam Vitals and nursing note reviewed.  Constitutional:      General: She is not in acute distress.    Appearance: She is well-developed.  HENT:     Right Ear: Tympanic membrane normal.     Left Ear: Tympanic membrane normal.     Nose: Congestion present.     Mouth/Throat:     Mouth: Mucous membranes are moist.     Pharynx: Oropharynx is clear.  Cardiovascular:     Rate and Rhythm: Normal rate and regular rhythm.     Heart sounds: Normal heart sounds.  Pulmonary:     Effort: Pulmonary effort is normal. No respiratory distress.     Breath sounds: Normal breath sounds.  Musculoskeletal:     Cervical back: Neck supple.  Skin:    General: Skin is warm and dry.  Neurological:     Mental Status: She is alert.      UC Treatments /  Results  Labs (all labs ordered are listed, but only abnormal results are displayed) Labs  Reviewed - No data to display  EKG   Radiology No results found.  Procedures Procedures (including critical care time)  Medications Ordered in UC Medications - No data to display  Initial Impression / Assessment and Plan / UC Course  I have reviewed the triage vital signs and the nursing notes.  Pertinent labs & imaging results that were available during my care of the patient were reviewed by me and considered in my medical decision making (see chart for details).    Acute sinusitis, cough.  Afebrile and vital signs are stable.  Patient is not able to take prednisone.  Treating today with amoxicillin and Tessalon Perles.  Instructed patient to follow-up with her PCP if she is not improving.  Education provided on sinus infection and cough.  She agrees to plan of care.  Final Clinical Impressions(s) / UC Diagnoses   Final diagnoses:  Acute non-recurrent maxillary sinusitis  Acute cough     Discharge Instructions      Take the amoxicillin and Tessalon as directed.  Follow-up with your primary care provider if your symptoms are not improving.      ED Prescriptions     Medication Sig Dispense Auth. Provider   benzonatate (TESSALON) 100 MG capsule Take 1 capsule (100 mg total) by mouth 3 (three) times daily as needed for cough. 21 capsule Mickie Bail, NP   amoxicillin (AMOXIL) 875 MG tablet Take 1 tablet (875 mg total) by mouth 2 (two) times daily for 7 days. 14 tablet Mickie Bail, NP      PDMP not reviewed this encounter.   Mickie Bail, NP 03/03/23 1040

## 2024-03-16 ENCOUNTER — Encounter: Payer: Self-pay | Admitting: Neurosurgery

## 2024-03-24 NOTE — Progress Notes (Signed)
 Referring Physician:  Valora Lynwood FALCON, MD [address] [place],  [place] [address]  Primary Physician:  Valora Lynwood FALCON, MD   Discussed the use of AI scribe software for clinical note transcription with the patient, who gave verbal consent to proceed.  History of Present Illness Vanessa Pratt is a 38 year old female with chronic low back pain who presents for evaluation of worsening symptoms.  She has had progressively worsening low back pain for two years, localized to the lower back with intermittent radiation to both sides of the back without consistent lateralization. Pain is worse with transitions such as bending or standing from sitting. She has no leg pain or radicular symptoms. Walking is generally tolerated, though she sometimes feels transient instability when rising after prolonged sitting or lying down or like she catches.  She underwent two spinal injections, including an x-ray guided epidural and a lateral injection, with minimal relief. Prior core-focused physical therapy did not help. MRI in March reportedly showed degenerative changes at L5-S1. She has not had spine surgery.  Back pain limits daily activities. She feels brief episodes where her legs momentarily feel as if they will not work when she first stands after prolonged sitting or lying. She avoids certain exercises such as barbell squats due to pain and notes a pulling sensation in her back with Romanian deadlifts after a few repetitions, though she continues to exercise regularly and sometimes works through the pain.   Conservative measures:  Physical therapy: has participated in PT at [facility] in 2024, she did go for at least 6 weeks but had no relief Multimodal medical therapy including regular antiinflammatories: hydrocodone  Injections: she had 2 lumbar ESIs at [facility], neither one was helpful   Past Surgery: no spinal surgeries   The symptoms are causing a significant  impact on the patient's life.   I have utilized the care everywhere function in epic to review the outside records available from external health systems.  Review of Systems:  A 10 point review of systems is negative, except for the pertinent positives and negatives detailed in the HPI.  Past Medical History: Past Medical History:  Diagnosis Date   Fall at home 12/2011   Gestational diabetes    glyburide   H/O varicella    Hypertension    gestational   Maternal anemia complicating pregnancy, childbirth, or the puerperium 03/02/2012   NSVD (normal spontaneous vaginal delivery - 10/28) 03/02/2012   Obese    Orbital pseudotumor    stable annual eye exams   Postpartum care following vaginal delivery 03/02/2012   Unspecified local infection of skin and subcutaneous tissue     Past Surgical History: Past Surgical History:  Procedure Laterality Date   BREAST ENHANCEMENT SURGERY     CHOLECYSTECTOMY     GASTRIC BYPASS  2020   MANDIBLE SURGERY     MASTOIDECTOMY     Left   TONSILLECTOMY      Allergies: Allergies as of 03/30/2024 - Review Complete 03/03/2023  Allergen Reaction Noted   Prednisone Other (See Comments) 02/29/2012   Tetracyclines & related Other (See Comments) 02/17/2012   Sulfa antibiotics Other (See Comments) 03/03/2023    Medications:  Current Outpatient Medications:    Calcium Citrate-Vitamin D (CALCIUM + D PO), Take 1 tablet by mouth daily., Disp: , Rfl:    cetirizine (ZYRTEC) 10 MG tablet, Take 10 mg by mouth daily as needed for allergies., Disp: , Rfl:  diphenhydrAMINE  (BENADRYL ) 25 MG tablet, Take 25 mg by mouth at bedtime as needed. For allergies, Disp: , Rfl:    fluticasone (FLONASE) 50 MCG/ACT nasal spray, Place 1 spray into both nostrils daily as needed for allergies., Disp: , Rfl:    Multiple Vitamin (MULTIVITAMIN WITH MINERALS) TABS tablet, Take 1 tablet by mouth daily., Disp: , Rfl:    polyethylene glycol powder (GLYCOLAX/MIRALAX) 17 GM/SCOOP  powder, Take 17 g by mouth daily as needed for constipation., Disp: , Rfl:   Social History: Social History   Tobacco Use   Smoking status: Never   Smokeless tobacco: Never  Vaping Use   Vaping status: Never Used  Substance Use Topics   Alcohol use: No   Drug use: No    Family Medical History: Family History  Problem Relation Age of Onset   Thrombocytopenia Mother    Cancer Father        pancreatic   Migraines Father    COPD Maternal Grandmother    Diabetes Maternal Grandfather    Cancer Maternal Grandfather        liver    Physical Examination: Vitals:   03/30/24 1113  BP: 124/74   NEUROLOGICAL:     Strength: Side Iliopsoas Quads Hamstring PF DF EHL  R 5 5 5 5 5 5   L 5 5 5 5 5 5    Reflexes are 2+ and symmetric at the patella and achilles.   Pain to palpation near   Imaging: MRI was personally reviewed, shows T2 signal abnormality in the facet joints bilaterally at L5-S1, this is worse than the other levels in the lumbar spine.  No significant foraminal stenosis, no significant central canal stenosis.  I have personally reviewed the images and agree with the above interpretation.  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 Pain exacerbated by positional changes and certain exercises. MRI shows degeneration at L5-S1 with joint irritation and fluid presence, indicating micro instability. No radiculopathy or nerve impingement. Previous treatments include chiropractic care, physical therapy, and two epidural injections with limited relief. Conservative management preferred due to her young age and active lifestyle. Risks of spinal fusion discussed, including potential for further degeneration and limited success rate. RFA considered as a less invasive option to target joint pain without significant risk of worsening joint condition. -  Referred to Pain for medial branch blocks or radiofrequency ablation (RFA) at L5-S1, referral placed, message sent - Advised to avoid spinal fusion due to her young age and active lifestyle.   Penne MICAEL Sharps MD/MSCR Neurosurgery   Spent a total of 45 minutes with the patient today discussing her imaging finding, going over her previous workup and management, face-to-face evaluation, physical examination, and documentation.

## 2024-03-25 ENCOUNTER — Inpatient Hospital Stay
Admission: RE | Admit: 2024-03-25 | Discharge: 2024-03-25 | Disposition: A | Discharge status: Home / Self Care | Payer: Self-pay | Source: Ambulatory Visit | Attending: Neurosurgery | Admitting: Neurosurgery

## 2024-03-25 ENCOUNTER — Other Ambulatory Visit: Payer: Self-pay | Admitting: Family Medicine

## 2024-03-25 ENCOUNTER — Telehealth: Payer: Self-pay | Admitting: Family Medicine

## 2024-03-25 DIAGNOSIS — Z049 Encounter for examination and observation for unspecified reason: Secondary | ICD-10-CM

## 2024-03-25 NOTE — Telephone Encounter (Signed)
 I left a message with [facility] medical records department. I have been sending a [employer] request for images and have not received the images. I am reaching out to see if they will be able to push those images or if we will need to get a disk for the images requested.  07/04/2022 X ray Lumbar  07/09/2022 MRI Lumbar  07/25/2022 X ray Lumbar

## 2024-03-25 NOTE — Telephone Encounter (Signed)
 [facility] returned my call and images have been shared.

## 2024-03-30 ENCOUNTER — Encounter: Payer: Self-pay | Admitting: Neurosurgery

## 2024-03-30 ENCOUNTER — Ambulatory Visit: Admitting: Neurosurgery

## 2024-03-30 VITALS — BP 124/74 | Ht 67.0 in | Wt 192.0 lb

## 2024-03-30 DIAGNOSIS — M549 Dorsalgia, unspecified: Secondary | ICD-10-CM

## 2024-03-30 DIAGNOSIS — M5136 Other intervertebral disc degeneration, lumbar region with discogenic back pain only: Secondary | ICD-10-CM | POA: Diagnosis not present

## 2024-04-22 DIAGNOSIS — R109 Unspecified abdominal pain: Secondary | ICD-10-CM | POA: Insufficient documentation

## 2024-05-09 ENCOUNTER — Ambulatory Visit: Admitting: Pain Medicine

## 2024-05-22 DIAGNOSIS — Z79899 Other long term (current) drug therapy: Secondary | ICD-10-CM | POA: Insufficient documentation

## 2024-05-22 DIAGNOSIS — Z789 Other specified health status: Secondary | ICD-10-CM | POA: Insufficient documentation

## 2024-05-22 DIAGNOSIS — G894 Chronic pain syndrome: Secondary | ICD-10-CM | POA: Insufficient documentation

## 2024-05-22 DIAGNOSIS — M899 Disorder of bone, unspecified: Secondary | ICD-10-CM | POA: Insufficient documentation

## 2024-05-22 NOTE — Progress Notes (Unsigned)
 PROVIDER NOTE: Interpretation of information contained herein should be left to medically-trained personnel. Specific patient instructions are provided elsewhere under Patient Instructions section of medical record. This document was created in part using AI and STT-dictation technology, any transcriptional errors that may result from this process are unintentional.  Patient: Vanessa Pratt  Service: E/M Encounter  Provider: Eric DELENA Como, MD  DOB: [birth year]  Delivery: Face-to-face  Specialty: Interventional Pain Management  MRN: [id number]  Setting: Ambulatory outpatient facility  Specialty designation: 09  Type: New Patient  Location: Outpatient office facility  PCP: Valora Lynwood FALCON, MD  DOS: 05/23/2024    Referring Prov.: Claudene Penne ORN, MD   Primary Reason(s) for Visit: Encounter for initial evaluation of one or more chronic problems (new to examiner) potentially causing chronic pain, and posing a threat to normal musculoskeletal function. (Level of risk: High) CC: No chief complaint on file.  HPI  Vanessa Pratt is a 39 y.o. year old, female patient, who comes for the first time to our practice referred by Claudene Penne ORN, MD for our initial evaluation of her chronic pain. She has Chronic hypertension complicating or reason for care during pregnancy; Gestational diabetes mellitus, class A2; NSVD (normal spontaneous vaginal delivery - 10/28); Postpartum care following vaginal delivery; Maternal anemia complicating pregnancy, childbirth, or the puerperium; Abdominal pain; Acute hepatitis; Chronic tension-type headache, not intractable; GERD (gastroesophageal reflux disease); Mandibular prognathism; Morbid obesity with BMI of 40.0-44.9, adult (HCC); Pes anserine bursitis; Pseudotumor cerebri; S/P gastric bypass; Chronic pain syndrome; Pharmacologic therapy; Disorder of skeletal system; and Problems influencing health status on their problem list. Today she comes in for evaluation of her No  chief complaint on file.  Pain Assessment: Location:     Radiating:   Onset:   Duration:   Quality:   Severity:  /10 (subjective, self-reported pain score)  Effect on ADL:   Timing:   Modifying factors:   BP:    HR:    Onset and Duration: {Hx; Onset and Duration:210120511} Cause of pain: {Hx; Cause:210120521} Severity: {Pain Severity:210120502} Timing: {Symptoms; Timing:210120501} Aggravating Factors: {Causes; Aggravating pain factors:210120507} Alleviating Factors: {Causes; Alleviating Factors:210120500} Associated Problems: {Hx; Associated problems:210120515} Quality of Pain: {Hx; Symptom quality or Descriptor:210120531} Previous Examinations or Tests: {Hx; Previous examinations or test:210120529} Previous Treatments: {Hx; Previous Treatment:210120503}  Vanessa Pratt is being evaluated for possible interventional pain management therapies for the treatment of her chronic pain.  Discussed the use of AI scribe software for clinical note transcription with the patient, who gave verbal consent to proceed.  History of Present Illness            Vanessa Pratt has been informed that this initial visit was an evaluation only.  On the follow up appointment I will go over the results, including ordered tests and available interventional therapies. At that time she will have the opportunity to decide whether to proceed with offered therapies or not. In the event that Vanessa Pratt prefers avoiding interventional options, this will conclude our involvement in the case.  Medication management recommendations may be provided upon request.  Patient informed that diagnostic tests may be ordered to assist in identifying underlying causes, narrow the list of differential diagnoses and aid in determining candidacy for (or contraindications to) planned therapeutic interventions.  Historic Controlled Substance Pharmacotherapy Review PMP and historical list of controlled substances: ***  Most recently  prescribed controlled substance(s): Opioid Analgesic: *** MME/day: *** mg/day  Historical Monitoring: The patient  reports no history of drug use. List of  prior UDS Testing: No results found for: MDMA, COCAINSCRNUR, PCPSCRNUR, PCPQUANT, CANNABQUANT, THCU, ETH, CBDTHCR, D8THCCBX, D9THCCBX Historical Background Evaluation: [place] PMP: PDMP reviewed during this encounter. Review of the past 52-months conducted.             PMP NARX Score Report:  Narcotic: 000 Sedative: 000 Stimulant: 000 [place] Department of public safety, offender search: Engineer, [employer] Information) Non-contributory Risk Assessment Profile: Aberrant behavior: None observed or detected today Risk factors for fatal opioid overdose: None identified today PMP NARX Overdose Risk Score: 000 Fatal overdose hazard ratio (HR): Calculation deferred Non-fatal overdose hazard ratio (HR): Calculation deferred Risk of opioid abuse or dependence: 0.7-3.0% with doses <= 36 MME/day and 6.1-26% with doses >= 120 MME/day. Substance use disorder (SUD) risk level: See below Personal History of Substance Abuse (SUD-Substance use disorder):  Alcohol:    Illegal Drugs:    Rx Drugs:    ORT Risk Level calculation:    ORT Scoring interpretation table:  Score <3 = Low Risk for SUD  Score between 4-7 = Moderate Risk for SUD  Score >8 = High Risk for Opioid Abuse   PHQ-2 Depression Scale:  Total score:    PHQ-2 Scoring interpretation table: (Score and probability of major depressive disorder)  Score 0 = No depression  Score 1 = 15.4% Probability  Score 2 = 21.1% Probability  Score 3 = 38.4% Probability  Score 4 = 45.5% Probability  Score 5 = 56.4% Probability  Score 6 = 78.6% Probability   PHQ-9 Depression Scale:  Total score:    PHQ-9 Scoring interpretation table:  Score 0-4 = No depression  Score 5-9 = Mild depression  Score 10-14 = Moderate depression  Score 15-19 = Moderately severe depression  Score 20-27 = Severe  depression (2.4 times higher risk of SUD and 2.89 times higher risk of overuse)   Pharmacologic Plan: As per protocol, I have not taken over any controlled substance management, pending the results of ordered tests and/or consults.            Initial impression: Pending review of available data and ordered tests.  Meds  Current Medications[1]  Imaging Review   Complexity Note: Imaging results reviewed.                         ROS  Cardiovascular: {Hx; Cardiovascular History:210120525} Pulmonary or Respiratory: {Hx; Pumonary and/or Respiratory History:210120523} Neurological: {Hx; Neurological:210120504} Psychological-Psychiatric: {Hx; Psychological-Psychiatric History:210120512} Gastrointestinal: {Hx; Gastrointestinal:210120527} Genitourinary: {Hx; Genitourinary:210120506} Hematological: {Hx; Hematological:210120510} Endocrine: {Hx; Endocrine history:210120509} Rheumatologic: {Hx; Rheumatological:210120530} Musculoskeletal: {Hx; Musculoskeletal:210120528} Work History: {Hx; Work history:210120514}  Allergies  Vanessa Pratt is allergic to prednisone, tetracyclines & related, and sulfa antibiotics.  Laboratory Chemistry Profile   Renal Lab Results  Component Value Date   BUN 9 03/02/2012   CREATININE 0.82 03/02/2012   GFRAA >90 03/02/2012   GFRNONAA >90 03/02/2012     Electrolytes Lab Results  Component Value Date   NA 136 03/02/2012   K 3.8 03/02/2012   CL 104 03/02/2012   CALCIUM 8.1 (L) 03/02/2012     Hepatic Lab Results  Component Value Date   AST 16 03/02/2012   ALT 9 03/02/2012   ALBUMIN 1.9 (L) 03/02/2012   ALKPHOS 136 (H) 03/02/2012     ID Lab Results  Component Value Date   HIV Non-reactive 08/07/2011   SARSCOV2NAA NEGATIVE 12/29/2019   PREGTESTUR NEGATIVE 01/02/2020     Bone No results found for: VD25OH, CI874NY7UNU, CI6874NY7, CI7874NY7, 25OHVITD1, 25OHVITD2, 25OHVITD3,  TESTOFREE, TESTOSTERONE   Endocrine Lab Results   Component Value Date   GLUCOSE 91 03/02/2012     Neuropathy Lab Results  Component Value Date   HIV Non-reactive 08/07/2011     CNS No results found for: COLORCSF, APPEARCSF, RBCCOUNTCSF, WBCCSF, POLYSCSF, LYMPHSCSF, EOSCSF, PROTEINCSF, GLUCCSF, JCVIRUS, CSFOLI, IGGCSF, LABACHR, ACETBL   Inflammation (CRP: Acute  ESR: Chronic) No results found for: CRP, ESRSEDRATE, LATICACIDVEN   Rheumatology Lab Results  Component Value Date   LABURIC 5.3 03/02/2012     Coagulation Lab Results  Component Value Date   PLT 162 03/02/2012     Cardiovascular Lab Results  Component Value Date   HGB 9.5 (L) 03/02/2012   HCT 27.9 (L) 03/02/2012     Screening Lab Results  Component Value Date   SARSCOV2NAA NEGATIVE 12/29/2019   HIV Non-reactive 08/07/2011   PREGTESTUR NEGATIVE 01/02/2020     Cancer No results found for: CEA, CA125, LABCA2   Allergens No results found for: ALMOND, APPLE, ASPARAGUS, AVOCADO, BANANA, BARLEY, BASIL, BAYLEAF, GREENBEAN, LIMABEAN, WHITEBEAN, BEEFIGE, REDBEET, BLUEBERRY, BROCCOLI, CABBAGE, MELON, CARROT, CASEIN, CASHEWNUT, CAULIFLOWER, CELERY     Note: Lab results reviewed.  PFSH  Drug: Vanessa Pratt  reports no history of drug use. Alcohol:  reports no history of alcohol use. Tobacco:  reports that she has never smoked. She has never used smokeless tobacco. Medical:  has a past medical history of Fall at home (12/2011), Gestational diabetes, H/O varicella, Hypertension, Maternal anemia complicating pregnancy, childbirth, or the puerperium (03/02/2012), NSVD (normal spontaneous vaginal delivery - 10/28) (03/02/2012), Obese, Orbital pseudotumor, Postpartum care following vaginal delivery (03/02/2012), and Unspecified local infection of skin and subcutaneous tissue. Family: family history includes COPD in her maternal grandmother; Cancer in her father and maternal grandfather;  Diabetes in her maternal grandfather; Migraines in her father; Thrombocytopenia in her mother.  Past Surgical History:  Procedure Laterality Date   BREAST ENHANCEMENT SURGERY     CHOLECYSTECTOMY     GASTRIC BYPASS  2020   MANDIBLE SURGERY     MASTOIDECTOMY     Left   TONSILLECTOMY     Active Ambulatory Problems    Diagnosis Date Noted   Chronic hypertension complicating or reason for care during pregnancy 03/01/2012   Gestational diabetes mellitus, class A2 03/01/2012   NSVD (normal spontaneous vaginal delivery - 10/28) 03/02/2012   Postpartum care following vaginal delivery 03/02/2012   Maternal anemia complicating pregnancy, childbirth, or the puerperium 03/02/2012   Abdominal pain 04/22/2024   Acute hepatitis 12/31/2021   Chronic tension-type headache, not intractable 11/20/2015   GERD (gastroesophageal reflux disease) 07/30/2018   Mandibular prognathism 02/04/2016   Morbid obesity with BMI of 40.0-44.9, adult (HCC) 12/17/2015   Pes anserine bursitis 10/09/2015   Pseudotumor cerebri 11/20/2015   S/P gastric bypass 03/07/2019   Chronic pain syndrome 05/22/2024   Pharmacologic therapy 05/22/2024   Disorder of skeletal system 05/22/2024   Problems influencing health status 05/22/2024   Resolved Ambulatory Problems    Diagnosis Date Noted   No Resolved Ambulatory Problems   Past Medical History:  Diagnosis Date   Fall at home 12/2011   Gestational diabetes    H/O varicella    Hypertension    Obese    Orbital pseudotumor    Unspecified local infection of skin and subcutaneous tissue    Constitutional Exam  General appearance: Well nourished, well developed, and well hydrated. In no apparent acute distress There were no vitals filed for this visit. BMI Assessment: Estimated body  mass index is 30.07 kg/m as calculated from the following:   Height as of 03/30/24: 5' 7 (1.702 m).   Weight as of 03/30/24: 192 lb (87.1 kg).  BMI interpretation table: BMI level Category  Range association with higher incidence of chronic pain  <18 kg/m2 Underweight   18.5-24.9 kg/m2 Ideal body weight   25-29.9 kg/m2 Overweight Increased incidence by 20%  30-34.9 kg/m2 Obese (Class I) Increased incidence by 68%  35-39.9 kg/m2 Severe obesity (Class II) Increased incidence by 136%  >40 kg/m2 Extreme obesity (Class III) Increased incidence by 254%   Patient's current BMI Ideal Body weight  There is no height or weight on file to calculate BMI. Patient weight not recorded   BMI Readings from Last 4 Encounters:  03/30/24 30.07 kg/m  12/30/19 30.54 kg/m  02/29/12 48.55 kg/m   Wt Readings from Last 4 Encounters:  03/30/24 192 lb (87.1 kg)  12/30/19 195 lb (88.5 kg)  02/29/12 (!) 310 lb (140.6 kg)    Psych/Mental status: Alert, oriented x 3 (person, place, & time)       Eyes: PERLA Respiratory: No evidence of acute respiratory distress  Assessment  Primary Diagnosis & Pertinent Problem List: The primary encounter diagnosis was Chronic pain syndrome. Diagnoses of Pharmacologic therapy, Disorder of skeletal system, and Problems influencing health status were also pertinent to this visit.  Visit Diagnosis (New problems to examiner): 1. Chronic pain syndrome   2. Pharmacologic therapy   3. Disorder of skeletal system   4. Problems influencing health status    Plan of Care (Initial workup plan)  Note: Vanessa Pratt was reminded that as per protocol, today's visit has been an evaluation only. We have not taken over the patient's controlled substance management.  Problem-specific plan: Assessment and Plan            Lab Orders  No laboratory test(s) ordered today   Imaging Orders  No imaging studies ordered today   Referral Orders  No referral(s) requested today   Procedure Orders    No procedure(s) ordered today   Pharmacotherapy (current): Medications ordered:  No orders of the defined types were placed in this encounter.  Medications administered  during this visit: Vanessa Pratt had no medications administered during this visit.   Analgesic Pharmacotherapy:  Opioid Analgesics: For patients currently taking or requesting to take opioid analgesics, in accordance with [place]  Medical Board Guidelines, we will assess their risks and indications for the use of these substances. After completing our evaluation, we may offer recommendations, but we no longer take patients for medication management. The prescribing physician will ultimately decide, based on his/her training and level of comfort whether to adopt any of the recommendations, including whether or not to prescribe such medicines.  Membrane stabilizer: To be determined at a later time  Muscle relaxant: To be determined at a later time  NSAID: To be determined at a later time  Other analgesic(s): To be determined at a later time   Interventional management options: Vanessa Pratt was informed that there is no guarantee that she would be a candidate for interventional therapies. The decision will be based on the results of diagnostic studies, as well as Vanessa Pratt risk profile.  Procedure(s) under consideration:  Pending results of ordered studies     Interventional Therapies  Risk Factors  Considerations  Medical Comorbidities:     Planned  Pending:      Under consideration:   Pending   Completed: (Analgesic benefit)1  None at this time   Therapeutic  Palliative (PRN) options:   None established   Completed by other providers:   None reported  1(Analgesic benefit): Expressed in percentage (%). (Local anesthetic[LA] +/- sedation  L.A.Local Anesthetic  Steroid benefit  Ongoing benefit)   Provider-requested follow-up: No follow-ups on file.  Future Appointments  Date Time Provider Department Center  05/23/2024  2:00 PM Tanya Glisson, MD [facility] None   I discussed the assessment and treatment plan with the patient. The patient was provided an  opportunity to ask questions and all were answered. The patient agreed with the plan and demonstrated an understanding of the instructions.  Patient advised to call back or seek an in-person evaluation if the symptoms or condition worsens.  Duration of encounter: *** minutes.  Total time on encounter, as per AMA guidelines included both the face-to-face and non-face-to-face time personally spent by the physician and/or other qualified health care professional(s) on the day of the encounter (includes time in activities that require the physician or other qualified health care professional and does not include time in activities normally performed by clinical staff). Physician's time may include the following activities when performed: Preparing to see the patient (e.g., pre-charting review of records, searching for previously ordered imaging, lab work, and nerve conduction tests) Review of prior analgesic pharmacotherapies. Reviewing PMP Interpreting ordered tests (e.g., lab work, imaging, nerve conduction tests) Performing post-procedure evaluations, including interpretation of diagnostic procedures Obtaining and/or reviewing separately obtained history Performing a medically appropriate examination and/or evaluation Counseling and educating the patient/family/caregiver Ordering medications, tests, or procedures Referring and communicating with other health care professionals (when not separately reported) Documenting clinical information in the electronic or other health record Independently interpreting results (not separately reported) and communicating results to the patient/ family/caregiver Care coordination (not separately reported)  Note by: Glisson DELENA Tanya, MD (TTS and AI technology used. I apologize for any typographical errors that were not detected and corrected.) Date: 05/23/2024; Time: 8:09 PM    [1]  Current Outpatient Medications:    Calcium Citrate-Vitamin D  (CALCIUM + D  PO), Take 1 tablet by mouth daily., Disp: , Rfl:    cetirizine (ZYRTEC) 10 MG tablet, Take 10 mg by mouth daily as needed for allergies., Disp: , Rfl:    diphenhydrAMINE  (BENADRYL ) 25 MG tablet, Take 25 mg by mouth at bedtime as needed. For allergies, Disp: , Rfl:    fluticasone (FLONASE) 50 MCG/ACT nasal spray, Place 1 spray into both nostrils daily as needed for allergies., Disp: , Rfl:    Multiple Vitamin (MULTIVITAMIN WITH MINERALS) TABS tablet, Take 1 tablet by mouth daily., Disp: , Rfl:    polyethylene glycol powder (GLYCOLAX/MIRALAX) 17 GM/SCOOP powder, Take 17 g by mouth daily as needed for constipation., Disp: , Rfl:

## 2024-05-22 NOTE — Patient Instructions (Incomplete)

## 2024-05-23 ENCOUNTER — Ambulatory Visit: Admitting: Pain Medicine

## 2024-05-23 ENCOUNTER — Ambulatory Visit: Admission: RE | Admit: 2024-05-23 | Source: Home / Self Care

## 2024-05-23 ENCOUNTER — Encounter: Payer: Self-pay | Admitting: Pain Medicine

## 2024-05-23 ENCOUNTER — Ambulatory Visit
Admission: RE | Admit: 2024-05-23 | Discharge: 2024-05-23 | Disposition: A | Source: Ambulatory Visit | Attending: Pain Medicine | Admitting: Pain Medicine

## 2024-05-23 VITALS — BP 129/52 | HR 65 | Temp 98.4°F | Resp 16 | Ht 67.0 in | Wt 192.0 lb

## 2024-05-23 DIAGNOSIS — G8929 Other chronic pain: Secondary | ICD-10-CM | POA: Insufficient documentation

## 2024-05-23 DIAGNOSIS — M545 Low back pain, unspecified: Secondary | ICD-10-CM | POA: Insufficient documentation

## 2024-05-23 DIAGNOSIS — M899 Disorder of bone, unspecified: Secondary | ICD-10-CM

## 2024-05-23 DIAGNOSIS — Z789 Other specified health status: Secondary | ICD-10-CM | POA: Insufficient documentation

## 2024-05-23 DIAGNOSIS — G894 Chronic pain syndrome: Secondary | ICD-10-CM | POA: Diagnosis not present

## 2024-05-23 DIAGNOSIS — M47816 Spondylosis without myelopathy or radiculopathy, lumbar region: Secondary | ICD-10-CM | POA: Insufficient documentation

## 2024-05-23 DIAGNOSIS — Z79899 Other long term (current) drug therapy: Secondary | ICD-10-CM | POA: Diagnosis not present

## 2024-05-23 DIAGNOSIS — M5459 Other low back pain: Secondary | ICD-10-CM | POA: Diagnosis present

## 2024-05-26 DIAGNOSIS — R1012 Left upper quadrant pain: Secondary | ICD-10-CM | POA: Insufficient documentation

## 2024-05-28 LAB — COMPLIANCE DRUG ANALYSIS, UR

## 2024-05-30 ENCOUNTER — Encounter: Payer: Self-pay | Admitting: Pain Medicine

## 2024-05-30 LAB — VITAMIN B12: Vitamin B-12: 1126 pg/mL (ref 232–1245)

## 2024-05-30 LAB — 25-HYDROXY VITAMIN D LCMS D2+D3
25-Hydroxy, Vitamin D-2: <1 ng/mL
25-Hydroxy, Vitamin D-3: 59 ng/mL
25-Hydroxy, Vitamin D: 59 ng/mL

## 2024-05-30 LAB — C-REACTIVE PROTEIN: CRP: <1 mg/L (ref 0–10)

## 2024-05-30 LAB — SEDIMENTATION RATE: Sed Rate: 15 mm/h (ref 0–32)

## 2024-05-30 LAB — MAGNESIUM: Magnesium: 2.3 mg/dL (ref 1.6–2.3)

## 2024-06-07 ENCOUNTER — Ambulatory Visit
Admission: RE | Admit: 2024-06-07 | Discharge: 2024-06-07 | Disposition: A | Source: Ambulatory Visit | Attending: Pain Medicine | Admitting: Pain Medicine

## 2024-06-07 ENCOUNTER — Ambulatory Visit (HOSPITAL_BASED_OUTPATIENT_CLINIC_OR_DEPARTMENT_OTHER): Admitting: Pain Medicine

## 2024-06-07 ENCOUNTER — Encounter: Payer: Self-pay | Admitting: Pain Medicine

## 2024-06-07 VITALS — BP 117/68 | HR 92 | Temp 97.5°F | Resp 15 | Ht 67.0 in | Wt 192.0 lb

## 2024-06-07 DIAGNOSIS — M47816 Spondylosis without myelopathy or radiculopathy, lumbar region: Secondary | ICD-10-CM

## 2024-06-07 DIAGNOSIS — M5459 Other low back pain: Secondary | ICD-10-CM

## 2024-06-07 DIAGNOSIS — G8929 Other chronic pain: Secondary | ICD-10-CM | POA: Diagnosis not present

## 2024-06-07 DIAGNOSIS — G894 Chronic pain syndrome: Secondary | ICD-10-CM

## 2024-06-07 DIAGNOSIS — M545 Low back pain, unspecified: Secondary | ICD-10-CM

## 2024-06-07 MED ORDER — ROPIVACAINE HCL 2 MG/ML IJ SOLN
18.0000 mL | Freq: Once | INTRAMUSCULAR | Status: AC
  Administered 2024-06-07: 18 mL via PERINEURAL
  Filled 2024-06-07: qty 20

## 2024-06-07 MED ORDER — LIDOCAINE HCL 2 % IJ SOLN
20.0000 mL | Freq: Once | INTRAMUSCULAR | Status: AC
  Administered 2024-06-07: 400 mg
  Filled 2024-06-07: qty 20

## 2024-06-07 MED ORDER — PENTAFLUOROPROP-TETRAFLUOROETH EX AERO: INHALATION_SPRAY | Freq: Once | CUTANEOUS | Status: AC

## 2024-06-07 MED ORDER — MIDAZOLAM HCL (PF) 2 MG/2ML IJ SOLN
0.5000 mg | Freq: Once | INTRAMUSCULAR | Status: AC
  Administered 2024-06-07: 2 mg via INTRAVENOUS
  Filled 2024-06-07: qty 2

## 2024-06-07 MED ORDER — FENTANYL CITRATE (PF) 100 MCG/2ML IJ SOLN
25.0000 ug | INTRAMUSCULAR | Status: AC | PRN
  Administered 2024-06-07: 100 ug via INTRAVENOUS
  Administered 2024-06-07: 50 ug via INTRAVENOUS
  Filled 2024-06-07: qty 2

## 2024-06-07 NOTE — Patient Instructions (Signed)
 ______________________________________________________________________    Post-Procedure Discharge Instructions  INSTRUCTIONS Apply ice:  Purpose: This will minimize any swelling and discomfort after procedure.  When: Day of procedure, as soon as you get home. How: Fill a plastic sandwich bag with crushed ice. Cover it with a small towel and apply to injection site. How long: (15 min on, 15 min off) Apply for 15 minutes then remove x 15 minutes.  Repeat sequence on day of procedure, until you go to bed. Apply heat:  Purpose: To treat any soreness and discomfort from the procedure. When: Starting the next day after the procedure. How: Apply heat to procedure site starting the day following the procedure. How long: May continue to repeat daily, until discomfort goes away. Food intake: Start with clear liquids (like water) and advance to regular food, as tolerated.  Physical activities: Keep activities to a minimum for the first 8 hours after the procedure. After that, then as tolerated. Driving: If you have received any sedation, be responsible and do not drive. You are not allowed to drive for 24 hours after having sedation. Blood thinner: (Applies only to those taking blood thinners) You may restart your blood thinner 6 hours after your procedure. Insulin : (Applies only to Diabetic patients taking insulin ) As soon as you can eat, you may resume your normal dosing schedule. Infection prevention: Keep procedure site clean and dry. Shower daily and clean area with soap and water.  PAIN DIARY Post-procedure Pain Diary: Extremely important that this be done correctly and accurately. Recorded information will be used to determine the next step in treatment. For the purpose of accuracy, follow these rules: Evaluate only the area treated. Do not report or include pain from an untreated area. For the purpose of this evaluation, ignore all other areas of pain, except for the treated area. After your  procedure, avoid taking a long nap and attempting to complete the pain diary after you wake up. Instead, set your alarm clock to go off every hour, on the hour, for the initial 8 hours after the procedure. Document the duration of the numbing medicine, and the relief you are getting from it. Do not go to sleep and attempt to complete it later. It will not be accurate. If you received sedation, it is likely that you were given a medication that may cause amnesia. Because of this, completing the diary at a later time may cause the information to be inaccurate. This information is needed to plan your care. Follow-up appointment: Keep your post-procedure follow-up evaluation appointment after the procedure (usually 2 weeks for most procedures, 6 weeks for radiofrequencies). DO NOT FORGET to bring you pain diary with you.   EXPECT... (What should I expect to see with my procedure?) From numbing medicine (AKA: Local Anesthetics): Numbness or decrease in pain. You may also experience some weakness, which if present, could last for the duration of the local anesthetic. Onset: Full effect within 15 minutes of injected. Duration: It will depend on the type of local anesthetic used. On the average, 1 to 8 hours.  From steroids (None used) From procedure: Some discomfort is to be expected once the numbing medicine wears off. This should be minimal if ice and heat are applied as instructed.  CALL IF... (When should I call?) You experience numbness and weakness that gets worse with time, as opposed to wearing off. New onset bowel or bladder incontinence. (Applies only to procedures done in the spine)  Emergency Numbers: Durning business hours (Monday - Thursday,  8:00 AM - 4:00 PM) (Friday, 9:00 AM - 12:00 Noon): [phone] After hours: [phone] NOTE: If you are having a problem and are unable connect with, or to talk to a provider, then go to your nearest urgent care or emergency department. If the  problem is serious and urgent, please call 911. ______________________________________________________________________

## 2024-06-08 ENCOUNTER — Telehealth: Payer: Self-pay | Admitting: *Deleted

## 2024-06-08 NOTE — Telephone Encounter (Signed)
 Post procedure call: reports that she is doing okay.

## 2024-06-27 ENCOUNTER — Ambulatory Visit: Admitting: Pain Medicine
